# Patient Record
Sex: Female | Born: 1995
Health system: Southern US, Community
[De-identification: ages and names within clinical notes are randomized; demographics above are authoritative.]

## PROBLEM LIST (undated history)

## (undated) DIAGNOSIS — J45909 Unspecified asthma, uncomplicated: Secondary | ICD-10-CM

## (undated) DIAGNOSIS — T7840XA Allergy, unspecified, initial encounter: Secondary | ICD-10-CM

## (undated) DIAGNOSIS — F419 Anxiety disorder, unspecified: Secondary | ICD-10-CM

## (undated) DIAGNOSIS — L309 Dermatitis, unspecified: Secondary | ICD-10-CM

## (undated) HISTORY — PX: MOUTH SURGERY: SHX715

## (undated) HISTORY — DX: Dermatitis, unspecified: L30.9

## (undated) HISTORY — DX: Anxiety disorder, unspecified: F41.9

## (undated) HISTORY — DX: Allergy, unspecified, initial encounter: T78.40XA

## (undated) HISTORY — DX: Unspecified asthma, uncomplicated: J45.909

---
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
## 2015-08-14 DIAGNOSIS — N946 Dysmenorrhea, unspecified: Secondary | ICD-10-CM | POA: Insufficient documentation

## 2016-08-04 HISTORY — PX: MOUTH SURGERY: SHX715

## 2018-01-25 ENCOUNTER — Ambulatory Visit
Admission: RE | Admit: 2018-01-25 | Discharge: 2018-01-25 | Disposition: A | Discharge status: Home / Self Care | Payer: Managed Care, Other (non HMO) | Source: Ambulatory Visit | Attending: Family Medicine | Admitting: Family Medicine

## 2018-01-25 ENCOUNTER — Ambulatory Visit (INDEPENDENT_AMBULATORY_CARE_PROVIDER_SITE_OTHER): Payer: Managed Care, Other (non HMO) | Admitting: Family Medicine

## 2018-01-25 ENCOUNTER — Encounter: Payer: Self-pay | Admitting: Family Medicine

## 2018-01-25 VITALS — BP 110/70 | HR 68 | Temp 99.9°F | Resp 16 | Ht 61.5 in | Wt 135.0 lb

## 2018-01-25 DIAGNOSIS — R05 Cough: Secondary | ICD-10-CM | POA: Diagnosis present

## 2018-01-25 DIAGNOSIS — J309 Allergic rhinitis, unspecified: Secondary | ICD-10-CM

## 2018-01-25 DIAGNOSIS — R059 Cough, unspecified: Secondary | ICD-10-CM

## 2018-01-25 MED ORDER — TRIAMCINOLONE ACETONIDE 55 MCG/ACT NA AERO: 2.0000 | INHALATION_SPRAY | Freq: Every day | NASAL | 12 refills | Status: DC

## 2018-01-25 MED ORDER — MONTELUKAST SODIUM 10 MG PO TABS
10.0000 mg | ORAL_TABLET | Freq: Every day | ORAL | 3 refills | Status: DC
Start: 2018-01-25 — End: 2018-06-19

## 2018-01-25 NOTE — Progress Notes (Signed)
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 In recent months she has had a nonproductive cough and at times feels like she is wheezing. Siome mild anxiety. There are no active problems to display for this patient.   Past Medical History:  Diagnosis Date  . Allergy   . Anxiety     Social History   Socioeconomic History  . Marital status: Married    Spouse name: Not on file  . Number of children: Not on file  . Years of education: Not on file  . Highest education level: Not on file  Occupational History  . Not on file  Social Needs  . Financial resource strain: Not on file  . Food insecurity:    Worry: Not on file    Inability: Not on file  . Transportation needs:    Medical: Not on file    Non-medical: Not on file  Tobacco Use  . Smoking status: Never Smoker  Substance and Sexual Activity  . Alcohol use: Never    Frequency: Never  . Drug use: Never  . Sexual activity: Not on file  Lifestyle  . Physical activity:    Days per week: Not on file    Minutes per session: Not on file  . Stress: Not on file  Relationships  . Social connections:    Talks on phone: Not on file    Gets together: Not on file    Attends religious service: Not on file    Active member of club or organization: Not on file    Attends meetings of clubs or organizations: Not on file    Relationship status: Not on file  . Intimate partner violence:    Fear of current or ex partner: Not on file    Emotionally abused: Not on file    Physically abused: Not on file    Forced sexual activity: Not on file  Other Topics Concern  . Not on file  Social History Narrative  . Not on file    Outpatient Encounter Medications as of 01/25/2018  Medication Sig  . cetirizine (ZYRTEC ALLERGY) 10 MG tablet Take 10 mg by mouth daily.  Marland Kitchen levonorgestrel-ethinyl estradiol (AVIANE,ALESSE,LESSINA)  0.1-20 MG-MCG tablet Take 1 tablet by mouth daily.   No facility-administered encounter medications on file as of 01/25/2018.     Allergies  Allergen Reactions  . Augmentin [Amoxicillin-Pot Clavulanate] Nausea And Vomiting  . Sulfa Antibiotics Nausea And Vomiting    Review of Systems  Constitutional: Negative.   HENT: Negative.        Sneezing rhinorrhea  Eyes: Negative.        Eyes itching  Respiratory: Positive for cough and wheezing.   Cardiovascular: Negative.   Gastrointestinal: Negative.   Genitourinary: Negative.   Musculoskeletal: Negative.   Skin: Negative.   Neurological: Negative.   Endo/Heme/Allergies: Positive for environmental allergies.  Psychiatric/Behavioral: The patient is nervous/anxious.     Objective:  BP 110/70 (BP Location: Right Arm, Patient Position: Sitting, Cuff Size: Normal)   Pulse 68   Temp 99.9 F (37.7 C) (Oral)   Resp 16   Ht 5' 1.5" (1.562 m)   Wt 135 lb (61.2 kg)   BMI 25.09 kg/m   Physical Exam  Constitutional: She is oriented to person, place, and time and well-developed, well-nourished, and in no distress.  HENT:  Head: Normocephalic and atraumatic.  Right Ear: External ear normal.  Left Ear: External ear normal.  Nose: Nose normal.  Mouth/Throat: Oropharynx is clear and moist.  2+ tonsils.  Eyes: Conjunctivae are normal. No scleral icterus.  Neck: No thyromegaly present.  Cardiovascular: Normal rate, regular rhythm and normal heart sounds.  Pulmonary/Chest: Effort normal and breath sounds normal.  Abdominal: Soft.  Lymphadenopathy:    She has no cervical adenopathy.  Neurological: She is alert and oriented to person, place, and time. Gait normal. GCS score is 15.  Skin: Skin is warm and dry.  Psychiatric: Mood, memory, affect and judgment normal.    Assessment and Plan :  1. Cough/Allergic Rhinitis./Mild Asthma - DG Chest 2 View; Future RTC 1 month. - montelukast (SINGULAIR) 10 MG tablet; Take 1 tablet (10 mg total)  by mouth at bedtime.  Dispense: 30 tablet; Refill: 3 - triamcinolone (NASACORT) 55 MCG/ACT AERO nasal inhaler; Place 2 sprays into the nose daily.  Dispense: 1 Inhaler; Refill: 12  I have done the exam and reviewed the chart and it is accurate to the best of my knowledge. [employer]  technology has been used and  any errors in dictation or transcription are unintentional. Julieanne Mansonichard Gilbert M.D. [facility]

## 2018-01-26 ENCOUNTER — Telehealth: Payer: Self-pay | Admitting: Family Medicine

## 2018-01-26 NOTE — Progress Notes (Signed)
Advised  ED 

## 2018-01-27 ENCOUNTER — Encounter: Payer: Self-pay | Admitting: Family Medicine

## 2018-02-24 ENCOUNTER — Ambulatory Visit (INDEPENDENT_AMBULATORY_CARE_PROVIDER_SITE_OTHER): Payer: Managed Care, Other (non HMO) | Admitting: Family Medicine

## 2018-02-24 VITALS — BP 93/61 | HR 66 | Temp 98.5°F | Resp 14 | Wt 135.0 lb

## 2018-02-24 DIAGNOSIS — J3089 Other allergic rhinitis: Secondary | ICD-10-CM

## 2018-02-24 NOTE — Progress Notes (Signed)
Belinda Bentley  MRN: [id number] DOB: [birth year]  Subjective:  HPI   The patient is a 22 year old female who presents for follow up after treatment for persistent cough.  She was last seen on 01/25/18.  At that time she was given Zyrtec, Singulair and Nasacort.  She reports that her cough is much improved and she continues on the medications.   There are no active problems to display for this patient.   Past Medical History:  Diagnosis Date  . Allergy   . Anxiety     Social History   Socioeconomic History  . Marital status: Married    Spouse name: Not on file  . Number of children: Not on file  . Years of education: Not on file  . Highest education level: Not on file  Occupational History  . Occupation: Geophysicist/field seismologistTeaching Assistant  Social Needs  . Financial resource strain: Not hard at all  . Food insecurity:    Worry: Never true    Inability: Not on file  . Transportation needs:    Medical: No    Non-medical: No  Tobacco Use  . Smoking status: Never Smoker  . Smokeless tobacco: Never Used  Substance and Sexual Activity  . Alcohol use: Never    Frequency: Never  . Drug use: Yes  . Sexual activity: Not on file  Lifestyle  . Physical activity:    Days per week: 1 day    Minutes per session: 30 min  . Stress: Not at all  Relationships  . Social connections:    Talks on phone: More than three times a week    Gets together: Three times a week    Attends religious service: More than 4 times per year    Active member of club or organization: Yes    Attends meetings of clubs or organizations: More than 4 times per year    Relationship status: Married  . Intimate partner violence:    Fear of current or ex partner: No    Emotionally abused: No    Physically abused: No    Forced sexual activity: No  Other Topics Concern  . Not on file  Social History Narrative  . Not on file    Outpatient Encounter Medications as of 02/24/2018  Medication Sig  . cetirizine (ZYRTEC  ALLERGY) 10 MG tablet Take 10 mg by mouth daily.  Marland Kitchen. levonorgestrel-ethinyl estradiol (AVIANE,ALESSE,LESSINA) 0.1-20 MG-MCG tablet Take 1 tablet by mouth daily.  . montelukast (SINGULAIR) 10 MG tablet Take 1 tablet (10 mg total) by mouth at bedtime.  . triamcinolone (NASACORT) 55 MCG/ACT AERO nasal inhaler Place 2 sprays into the nose daily.   No facility-administered encounter medications on file as of 02/24/2018.     Allergies  Allergen Reactions  . Augmentin [Amoxicillin-Pot Clavulanate] Nausea And Vomiting  . Sulfa Antibiotics Nausea And Vomiting    Review of Systems  Constitutional: Negative.   HENT: Negative for congestion, sinus pain and sore throat.   Eyes: Negative.   Respiratory: Negative for cough and shortness of breath.   Cardiovascular: Negative for chest pain and palpitations.  Skin: Negative.   Psychiatric/Behavioral: Negative.     Objective:  BP 93/61 (BP Location: Right Arm, Patient Position: Sitting, Cuff Size: Normal)   Pulse 66   Temp 98.5 F (36.9 C) (Oral)   Resp 14   Wt 135 lb (61.2 kg)   BMI 25.09 kg/m   Physical Exam  Constitutional: She is oriented to person, place, and time  and well-developed, well-nourished, and in no distress.  HENT:  Head: Normocephalic and atraumatic.  Eyes: Conjunctivae are normal.  Neck: No thyromegaly present.  Very small submental lymph node--mobile--small pea size.  Cardiovascular: Normal rate, regular rhythm and normal heart sounds.  Pulmonary/Chest: Effort normal and breath sounds normal.  Lymphadenopathy:    She has no cervical adenopathy.  Neurological: She is alert and oriented to person, place, and time. Gait normal. GCS score is 15.  Skin: Skin is warm and dry.  Psychiatric: Mood, memory, affect and judgment normal.    Assessment and Plan :  Allergic Rhinitis Improved.Consider stopping monteleukast to see if therapeutic. RTC prn.  I have done the exam and reviewed the chart and it is accurate to the best  of my knowledge. Dentist has been used and  any errors in dictation or transcription are unintentional. Julieanne Manson M.D. [facility]

## 2018-05-28 ENCOUNTER — Ambulatory Visit (INDEPENDENT_AMBULATORY_CARE_PROVIDER_SITE_OTHER): Payer: Managed Care, Other (non HMO) | Admitting: Physician Assistant

## 2018-05-28 ENCOUNTER — Encounter: Payer: Self-pay | Admitting: Physician Assistant

## 2018-05-28 VITALS — BP 110/70 | HR 87 | Temp 97.7°F | Resp 16 | Wt 135.0 lb

## 2018-05-28 DIAGNOSIS — J029 Acute pharyngitis, unspecified: Secondary | ICD-10-CM

## 2018-05-28 DIAGNOSIS — J020 Streptococcal pharyngitis: Secondary | ICD-10-CM | POA: Diagnosis not present

## 2018-05-28 LAB — POCT RAPID STREP A (OFFICE): Rapid Strep A Screen: POSITIVE — AB

## 2018-05-28 MED ORDER — PREDNISONE 20 MG PO TABS: 20.0000 mg | ORAL_TABLET | Freq: Every day | ORAL | 0 refills | Status: AC

## 2018-05-28 MED ORDER — AZITHROMYCIN 250 MG PO TABS: ORAL_TABLET | ORAL | 0 refills | Status: DC

## 2018-05-28 NOTE — Patient Instructions (Addendum)

## 2018-05-28 NOTE — Progress Notes (Signed)
Patient: Belinda Bentley Female    DOB: 04-19-1996   22 y.o.   MRN: [id number] Visit Date: 05/28/2018  Today's Provider: Trey Sailors, PA-C   Chief Complaint  Patient presents with  . Sore Throat   Subjective:    HPI Sore Throat: Patient complains of sore throat. Associated symptoms include enlarged tonsils, sneezing and sore throat.Onset of symptoms was 2 days ago, gradually worsening since that time. She is drinking plenty of fluids. She has not had recent close exposure to someone with proven streptococcal pharyngitis. 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      Allergies  Allergen Reactions  . Augmentin [Amoxicillin-Pot Clavulanate] Nausea And Vomiting  . Sulfa Antibiotics Nausea And Vomiting     Current Outpatient Medications:  .  cetirizine (ZYRTEC ALLERGY) 10 MG tablet, Take 10 mg by mouth daily., Disp: , Rfl:  .  levonorgestrel-ethinyl estradiol (AVIANE,ALESSE,LESSINA) 0.1-20 MG-MCG tablet, Take 1 tablet by mouth daily., Disp: , Rfl:  .  montelukast (SINGULAIR) 10 MG tablet, Take 1 tablet (10 mg total) by mouth at bedtime., Disp: 30 tablet, Rfl: 3 .  triamcinolone (NASACORT) 55 MCG/ACT AERO nasal inhaler, Place 2 sprays into the nose daily., Disp: 1 Inhaler, Rfl: 12  Review of Systems  Constitutional: Positive for fatigue.  HENT: Positive for sneezing and sore throat.     Social History   Tobacco Use  . Smoking status: Never Smoker  . Smokeless tobacco: Never Used  Substance Use Topics  . Alcohol use: Never    Frequency: Never   Objective:   BP 110/70 (BP Location: Right Arm, Patient Position: Sitting, Cuff Size: Normal)   Pulse 87   Temp 97.7 F (36.5 C) (Oral)   Resp 16   Wt 135 lb (61.2 kg)   SpO2 98%   BMI 25.09 kg/m  Vitals:   05/28/18 0932  BP: 110/70  Pulse: 87  Resp: 16  Temp: 97.7 F (36.5 C)  TempSrc: Oral  SpO2: 98%  Weight: 135 lb (61.2 kg)     Physical Exam  Constitutional: She  is oriented to person, place, and time. She appears well-developed and well-nourished. She does not appear ill.  HENT:  Right Ear: Tympanic membrane and ear canal normal.  Left Ear: Tympanic membrane and ear canal normal.  Mouth/Throat: Uvula is midline and mucous membranes are normal. No uvula swelling. Posterior oropharyngeal edema and posterior oropharyngeal erythema present. No oropharyngeal exudate.  Neck: Neck supple.  Cardiovascular: Normal rate, regular rhythm and normal heart sounds.  Pulmonary/Chest: Effort normal and breath sounds normal.  Lymphadenopathy:    She has no cervical adenopathy.  Neurological: She is alert and oriented to person, place, and time.  Skin: Skin is warm and dry.  Psychiatric: She has a normal mood and affect. Her behavior is normal.        Assessment & Plan:     1. Sore throat  Rapid strep positive. N/V with augmentin. Will treat as below. - POCT rapid strep A  2. Strep sore throat  - azithromycin (ZITHROMAX) 250 MG tablet; Take 2 pills on day 1, 1 pill on each of the following four days.  Dispense: 6 each; Refill: 0 - predniSONE (DELTASONE) 20 MG tablet; Take 1 tablet (20 mg total) by mouth daily with breakfast for 3 days.  Dispense: 3 tablet; Refill: 0  Return if symptoms worsen or fail to improve.  The entirety of the information documented in the  History of Present Illness, Review of Systems and Physical Exam were personally obtained by me. Portions of this information were initially documented by Rondel Baton, CMA and reviewed by me for thoroughness and accuracy.        Trey Sailors, PA-C  [facility]

## 2018-06-10 ENCOUNTER — Encounter: Payer: Self-pay | Admitting: Family Medicine

## 2018-06-10 ENCOUNTER — Ambulatory Visit (INDEPENDENT_AMBULATORY_CARE_PROVIDER_SITE_OTHER): Payer: Managed Care, Other (non HMO) | Admitting: Family Medicine

## 2018-06-10 VITALS — BP 113/78 | HR 98 | Temp 98.4°F | Wt 133.8 lb

## 2018-06-10 DIAGNOSIS — K119 Disease of salivary gland, unspecified: Secondary | ICD-10-CM | POA: Diagnosis not present

## 2018-06-10 NOTE — Progress Notes (Signed)
Patient: Belinda Bentley Female    DOB: 05/22/1996   22 y.o.   MRN: [id number] Visit Date: 06/10/2018  Today's Provider: Shirlee Latch, MD   Chief Complaint  Patient presents with  . Sore Throat   Subjective:    I, Presley Raddle, CMA, am acting as a scribe for Shirlee Latch, MD.   Sore Throat   This is a recurrent problem. Episode onset: 2 days ago. Neither side of throat is experiencing more pain than the other. The patient is experiencing no pain. Associated symptoms include swollen glands. She has had exposure to strep (Patient was diagnosed on 05/28/2018 and treated with Z-pak and Prednisone).  Symtpoms improved with medication but now she has gotten worse (describes it as uncomfortable and not painful).  Tonsils feel larger than normal.   Allergies  Allergen Reactions  . Augmentin [Amoxicillin-Pot Clavulanate] Nausea And Vomiting  . Sulfa Antibiotics Nausea And Vomiting     Current Outpatient Medications:  .  cetirizine (ZYRTEC ALLERGY) 10 MG tablet, Take 10 mg by mouth daily., Disp: , Rfl:  .  levonorgestrel-ethinyl estradiol (AVIANE,ALESSE,LESSINA) 0.1-20 MG-MCG tablet, Take 1 tablet by mouth daily., Disp: , Rfl:  .  montelukast (SINGULAIR) 10 MG tablet, Take 1 tablet (10 mg total) by mouth at bedtime., Disp: 30 tablet, Rfl: 3 .  triamcinolone (NASACORT) 55 MCG/ACT AERO nasal inhaler, Place 2 sprays into the nose daily. (Patient not taking: Reported on 06/10/2018), Disp: 1 Inhaler, Rfl: 12  Review of Systems  Constitutional: Negative.   HENT: Sore throat: discomfort, and tonsils are swollen.   Cardiovascular: Negative.     Social History   Tobacco Use  . Smoking status: Never Smoker  . Smokeless tobacco: Never Used  Substance Use Topics  . Alcohol use: Never    Frequency: Never   Objective:   BP 113/78 (BP Location: Right Arm, Patient Position: Sitting, Cuff Size: Normal)   Pulse 98   Temp 98.4 F (36.9 C) (Oral)   Wt 133 lb 12.8 oz (60.7 kg)    SpO2 99%   BMI 24.87 kg/m  Vitals:   06/10/18 1504  BP: 113/78  Pulse: 98  Temp: 98.4 F (36.9 C)  TempSrc: Oral  SpO2: 99%  Weight: 133 lb 12.8 oz (60.7 kg)     Physical Exam  Constitutional: She is oriented to person, place, and time. She appears well-developed and well-nourished. She does not appear ill. No distress.  HENT:  Head: Normocephalic and atraumatic.  Right Ear: Tympanic membrane and ear canal normal.  Left Ear: Tympanic membrane and ear canal normal.  Mouth/Throat: Uvula is midline, oropharynx is clear and moist and mucous membranes are normal. No oral lesions. No oropharyngeal exudate, posterior oropharyngeal edema, posterior oropharyngeal erythema or tonsillar abscesses. No tonsillar exudate.  Mildly swollen and tender submandibular salivary glands  Eyes: Pupils are equal, round, and reactive to light.  Neck: Trachea normal. Neck supple. No thyromegaly present.  Cardiovascular: Normal rate, regular rhythm, normal heart sounds and intact distal pulses.  No murmur heard. Pulmonary/Chest: Effort normal and breath sounds normal. No respiratory distress. She has no wheezes. She has no rales.  Abdominal: Soft. There is no tenderness.  Lymphadenopathy:    She has no cervical adenopathy.  Neurological: She is alert and oriented to person, place, and time.  Skin: Skin is warm and dry. Capillary refill takes less than 2 seconds. No rash noted.  Psychiatric: She has a normal mood and affect. Her behavior is normal.  Vitals reviewed.  Assessment & Plan:   1. Salivary gland disease - no evidence of pharyngitis - mildly swollen submandibular salivary gladns - likely related to recent pharyngitis - discussed symptomatic management, natural course, and return precautions    Return if symptoms worsen or fail to improve.   The entirety of the information documented in the History of Present Illness, Review of Systems and Physical Exam were personally obtained by  me. Portions of this information were initially documented by Presley Raddle, CMA and reviewed by me for thoroughness and accuracy.    Erasmo Downer, MD, MPH [facility] 06/10/2018 3:29 PM

## 2018-06-19 ENCOUNTER — Other Ambulatory Visit: Payer: Self-pay | Admitting: Family Medicine

## 2018-06-19 DIAGNOSIS — R059 Cough, unspecified: Secondary | ICD-10-CM

## 2018-06-19 DIAGNOSIS — R05 Cough: Secondary | ICD-10-CM

## 2018-09-07 ENCOUNTER — Ambulatory Visit (INDEPENDENT_AMBULATORY_CARE_PROVIDER_SITE_OTHER): Payer: Managed Care, Other (non HMO) | Admitting: Family Medicine

## 2018-09-07 ENCOUNTER — Encounter: Payer: Self-pay | Admitting: Family Medicine

## 2018-09-07 VITALS — BP 102/62 | HR 84 | Temp 98.3°F | Wt 133.4 lb

## 2018-09-07 DIAGNOSIS — J4521 Mild intermittent asthma with (acute) exacerbation: Secondary | ICD-10-CM

## 2018-09-07 DIAGNOSIS — R05 Cough: Secondary | ICD-10-CM | POA: Diagnosis not present

## 2018-09-07 DIAGNOSIS — R059 Cough, unspecified: Secondary | ICD-10-CM

## 2018-09-07 MED ORDER — HYDROCOD POLST-CPM POLST ER 10-8 MG/5ML PO SUER: 5.0000 mL | Freq: Two times a day (BID) | ORAL | 0 refills | Status: DC | PRN

## 2018-09-07 NOTE — Progress Notes (Signed)
Patient: Belinda Bentley Female    DOB: August 01, 1996   22 y.o.   MRN: [id number] Visit Date: 09/07/2018  Today's Provider: [name], MD   Chief Complaint  Patient presents with  . URI   Subjective:     URI   This is a new problem. Episode onset: 2 weeks ago. The problem has been gradually worsening. There has been no fever. Associated symptoms include congestion, coughing and rhinorrhea. She has tried inhaler use (Sudafed, Vicks, Mucinex, nasal spray, honey, using infuser and humidifier) for the symptoms. The treatment provided no relief.    Allergies  Allergen Reactions  . Augmentin [Amoxicillin-Pot Clavulanate] Nausea And Vomiting  . Sulfa Antibiotics Nausea And Vomiting     Current Outpatient Medications:  .  cetirizine (ZYRTEC ALLERGY) 10 MG tablet, Take 10 mg by mouth daily., Disp: , Rfl:  .  levonorgestrel-ethinyl estradiol (AVIANE,ALESSE,LESSINA) 0.1-20 MG-MCG tablet, Take 1 tablet by mouth daily., Disp: , Rfl:  .  montelukast (SINGULAIR) 10 MG tablet, TAKE 1 TABLET BY MOUTH EVERY NIGHT AT BEDTIME, Disp: 90 tablet, Rfl: 0 .  triamcinolone (NASACORT) 55 MCG/ACT AERO nasal inhaler, Place 2 sprays into the nose daily., Disp: 1 Inhaler, Rfl: 12  Review of Systems  Constitutional: Negative.   HENT: Positive for congestion and rhinorrhea.   Eyes: Negative.   Respiratory: Positive for cough.   Gastrointestinal: Negative.   Endocrine: Negative.   Musculoskeletal: Negative.   Allergic/Immunologic: Negative.   Neurological: Negative.   Psychiatric/Behavioral: Negative.     Social History   Tobacco Use  . Smoking status: Never Smoker  . Smokeless tobacco: Never Used  Substance Use Topics  . Alcohol use: Never    Frequency: Never      Objective:   BP 102/62 (BP Location: Right Arm, Patient Position: Sitting, Cuff Size: Normal)   Pulse 84   Temp 98.3 F (36.8 C) (Oral)   Wt 133 lb 6.4 oz (60.5 kg)   SpO2 98%   BMI 24.80 kg/m  Vitals:    09/07/18 1425  BP: 102/62  Pulse: 84  Temp: 98.3 F (36.8 C)  TempSrc: Oral  SpO2: 98%  Weight: 133 lb 6.4 oz (60.5 kg)     Physical Exam Constitutional:      Appearance: Normal appearance.  HENT:     Head: Normocephalic and atraumatic.     Right Ear: External ear normal.     Left Ear: External ear normal.     Nose: Nose normal.     Mouth/Throat:     Pharynx: Oropharynx is clear.  Eyes:     General: No scleral icterus.    Conjunctiva/sclera: Conjunctivae normal.  Cardiovascular:     Rate and Rhythm: Normal rate and regular rhythm.     Pulses: Normal pulses.     Heart sounds: Normal heart sounds.  Pulmonary:     Effort: Pulmonary effort is normal.     Breath sounds: Wheezing present.     Comments: Mild expiratory wheezes.  Improved after Xopenex nebulizer. Abdominal:     Palpations: Abdomen is soft.  Lymphadenopathy:     Cervical: No cervical adenopathy.  Skin:    General: Skin is warm and dry.  Neurological:     General: No focal deficit present.     Mental Status: She is alert and oriented to person, place, and time.  Psychiatric:        Mood and Affect: Mood normal.        Behavior:  Behavior normal.        Thought Content: Thought content normal.        Judgment: Judgment normal.         Assessment & Plan    1. Cough Tussionex for nocturnal cough.  2. Mild intermittent asthmatic bronchitis with acute exacerbation Add Anoro 1 puff daily.  I have done the exam and reviewed the chart and it is accurate to the best of my knowledge. Dentist has been used and  any errors in dictation or transcription are unintentional. Julieanne Manson M.D. [facility]      [name], MD  [facility]

## 2018-09-19 ENCOUNTER — Other Ambulatory Visit: Payer: Self-pay | Admitting: Family Medicine

## 2018-09-19 DIAGNOSIS — R05 Cough: Secondary | ICD-10-CM

## 2018-09-19 DIAGNOSIS — R059 Cough, unspecified: Secondary | ICD-10-CM

## 2018-09-20 NOTE — Telephone Encounter (Signed)
Pharmacy requesting refills. Thanks!  

## 2018-10-01 ENCOUNTER — Ambulatory Visit (INDEPENDENT_AMBULATORY_CARE_PROVIDER_SITE_OTHER): Payer: Managed Care, Other (non HMO) | Admitting: Physician Assistant

## 2018-10-01 ENCOUNTER — Encounter: Payer: Self-pay | Admitting: Physician Assistant

## 2018-10-01 VITALS — BP 116/68 | Temp 100.9°F | Wt 133.4 lb

## 2018-10-01 DIAGNOSIS — J029 Acute pharyngitis, unspecified: Secondary | ICD-10-CM

## 2018-10-01 DIAGNOSIS — J020 Streptococcal pharyngitis: Secondary | ICD-10-CM

## 2018-10-01 LAB — POCT RAPID STREP A (OFFICE): Rapid Strep A Screen: POSITIVE — AB

## 2018-10-01 MED ORDER — AZITHROMYCIN 250 MG PO TABS: ORAL_TABLET | ORAL | 0 refills | Status: DC

## 2018-10-01 MED ORDER — PREDNISONE 20 MG PO TABS: 20.0000 mg | ORAL_TABLET | Freq: Every day | ORAL | 0 refills | Status: AC

## 2018-10-01 NOTE — Patient Instructions (Signed)

## 2018-10-01 NOTE — Progress Notes (Signed)
Patient: Belinda Bentley Female    DOB: Aug 21, 1995   23 y.o.   MRN: [id number] Visit Date: 10/01/2018  Today's Provider: Trey Sailors, PA-C   Chief Complaint  Patient presents with  . URI   Subjective:     URI   This is a new problem. The current episode started in the past 7 days. The problem has been gradually worsening. The maximum temperature recorded prior to her arrival was 102 - 102.9 F (arranges from 100 to 102). The fever has been present for 1 to 2 days. Associated symptoms include a sore throat. She has tried nothing for the symptoms.    Allergies  Allergen Reactions  . Augmentin [Amoxicillin-Pot Clavulanate] Nausea And Vomiting  . Sulfa Antibiotics Nausea And Vomiting     Current Outpatient Medications:  .  cetirizine (ZYRTEC ALLERGY) 10 MG tablet, Take 10 mg by mouth daily., Disp: , Rfl:  .  chlorpheniramine-HYDROcodone (TUSSIONEX PENNKINETIC ER) 10-8 MG/5ML SUER, Take 5 mLs by mouth every 12 (twelve) hours as needed for cough., Disp: 140 mL, Rfl: 0 .  levonorgestrel-ethinyl estradiol (AVIANE,ALESSE,LESSINA) 0.1-20 MG-MCG tablet, Take 1 tablet by mouth daily., Disp: , Rfl:  .  montelukast (SINGULAIR) 10 MG tablet, TAKE 1 TABLET BY MOUTH EVERY NIGHT AT BEDTIME, Disp: 90 tablet, Rfl: 3 .  triamcinolone (NASACORT) 55 MCG/ACT AERO nasal inhaler, Place 2 sprays into the nose daily., Disp: 1 Inhaler, Rfl: 12  Review of Systems  Constitutional: Positive for appetite change and fever.  HENT: Positive for sore throat.   Respiratory: Negative.   Neurological: Negative.     Social History   Tobacco Use  . Smoking status: Never Smoker  . Smokeless tobacco: Never Used  Substance Use Topics  . Alcohol use: Never    Frequency: Never      Objective:   BP 116/68 (BP Location: Right Arm, Patient Position: Sitting, Cuff Size: Normal)   Temp (!) 100.9 F (38.3 C) (Oral)   Wt 133 lb 6.4 oz (60.5 kg)   BMI 24.80 kg/m  Vitals:   10/01/18 0913  BP: 116/68    Temp: (!) 100.9 F (38.3 C)  TempSrc: Oral  Weight: 133 lb 6.4 oz (60.5 kg)     Physical Exam Constitutional:      Appearance: Normal appearance. She is normal weight. She is not ill-appearing.  HENT:     Right Ear: Tympanic membrane and ear canal normal.     Left Ear: Tympanic membrane and ear canal normal.     Mouth/Throat:     Mouth: Mucous membranes are moist.     Pharynx: Pharyngeal swelling and posterior oropharyngeal erythema present. No oropharyngeal exudate or uvula swelling.     Tonsils: Tonsillar exudate present. Swelling: 3+ on the right. 3+ on the left.  Cardiovascular:     Rate and Rhythm: Normal rate and regular rhythm.     Heart sounds: Normal heart sounds.  Pulmonary:     Effort: Pulmonary effort is normal.     Breath sounds: Normal breath sounds.  Lymphadenopathy:     Cervical: No cervical adenopathy.  Skin:    General: Skin is warm and dry.  Neurological:     Mental Status: She is oriented to person, place, and time. Mental status is at baseline.  Psychiatric:        Mood and Affect: Mood normal.        Behavior: Behavior normal.         Assessment &  Plan    1. 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 She might considered getting tonsils removed.   - azithromycin (ZITHROMAX) 250 MG tablet; Take 2 pills on day 1, then 1 pill on each of the following four days.  Dispense: 6 tablet; Refill: 0 - predniSONE (DELTASONE) 20 MG tablet; Take 1 tablet (20 mg total) by mouth daily with breakfast for 5 days.  Dispense: 5 tablet; Refill: 0  2. Sore throat  - POCT rapid strep A  The entirety of the information documented in the History of Present Illness, Review of Systems and Physical Exam were personally obtained by me. Portions of this information were initially documented by Rondel Baton, CMA and reviewed by me for thoroughness and accuracy.   Return if symptoms  worsen or fail to improve.      Trey Sailors, PA-C  [facility]

## 2019-05-18 ENCOUNTER — Other Ambulatory Visit: Payer: Self-pay

## 2019-05-18 DIAGNOSIS — Z20822 Contact with and (suspected) exposure to covid-19: Secondary | ICD-10-CM

## 2019-05-19 LAB — NOVEL CORONAVIRUS, NAA: SARS-CoV-2, NAA: NOT DETECTED

## 2019-07-14 ENCOUNTER — Encounter: Payer: Self-pay | Admitting: Adult Health

## 2019-07-14 ENCOUNTER — Telehealth (INDEPENDENT_AMBULATORY_CARE_PROVIDER_SITE_OTHER): Payer: Managed Care, Other (non HMO) | Admitting: Adult Health

## 2019-07-14 ENCOUNTER — Telehealth: Payer: Self-pay

## 2019-07-14 DIAGNOSIS — Z8709 Personal history of other diseases of the respiratory system: Secondary | ICD-10-CM

## 2019-07-14 DIAGNOSIS — Z20828 Contact with and (suspected) exposure to other viral communicable diseases: Secondary | ICD-10-CM

## 2019-07-14 DIAGNOSIS — J029 Acute pharyngitis, unspecified: Secondary | ICD-10-CM

## 2019-07-14 MED ORDER — AZITHROMYCIN 250 MG PO TABS: ORAL_TABLET | ORAL | 0 refills | Status: DC

## 2019-07-14 MED ORDER — PREDNISONE 10 MG (21) PO TBPK: ORAL_TABLET | ORAL | 0 refills | Status: DC

## 2019-07-14 NOTE — Telephone Encounter (Signed)
Copied from [place] [id number]. Topic: General - Other >> Jul 14, 2019 12:36 PM Celene Kras wrote: Reason for CRM: Pt scheduled for mychart video visit. At 1:20 today. Please advise.

## 2019-07-14 NOTE — Progress Notes (Signed)
Patient: Belinda BachelorKelsi Woodin Female    DOB: [birth year]   23 y.o.   MRN: [id number] Visit Date: 07/14/2019  Today's Provider: Jairo BenMichelle Smith Flinchum, FNP   Chief Complaint  Patient presents with  . Sore Throat   Subjective:    Virtual Visit via Video Note  I connected with [name] on 07/14/19 at  1:20 PM EST by a video enabled telemedicine application and verified that I am speaking with the correct person using two identifiers.  Location: Patient: at home  Provider: Provider: Provider's [facility] at  [facility], [place].      I discussed the limitations of evaluation and management by telemedicine and the availability of in person appointments. The patient expressed understanding and agreed to proceed.   I discussed the assessment and treatment plan with the patient. The patient was provided an opportunity to ask questions and all were answered. The patient agreed with the plan and demonstrated an understanding of the instructions.   The patient was advised to call back or seek an in-person evaluation if the symptoms worsen or if the condition fails to improve as anticipated.  I provided 15  minutes of non-face-to-face time during this encounter.   Sore Throat  This is a new problem. The current episode started yesterday. The problem has been unchanged. The maximum temperature recorded prior to her arrival was 102 - 102.9 F. The fever has been present for less than 1 day. Associated symptoms include swollen glands. Pertinent negatives include no abdominal pain, congestion, coughing, diarrhea, drooling, ear discharge, ear pain, headaches, hoarse voice, plugged ear sensation, neck pain, shortness of breath, stridor, trouble swallowing (painful swallowing - do difficulty swallowing ) or vomiting. She has had no exposure to strep or mono. 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 She has tried NSAIDs  for the symptoms. The treatment provided no relief.  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  She denies any known exposures.   She reports she feels like she does with strep in past.  She has mild nasal drainage, itchy throat last night she thought her allergies were acting up.  Tonsils are enlarged " more than my normal" she reports.  She denies any exudate. She denies any nasal symptoms today. Denies any chest pain or pain with breathing.   Patient  denies any chills, rash, chest pain, shortness of breath, nausea, vomiting, or diarrhea.    Allergies  Allergen Reactions  . Augmentin [Amoxicillin-Pot Clavulanate] Nausea And Vomiting  . Sulfa Antibiotics Nausea And Vomiting     Current Outpatient Medications:  .  cetirizine (ZYRTEC ALLERGY) 10 MG tablet, Take 10 mg by mouth daily., Disp: , Rfl:  .  levonorgestrel-ethinyl estradiol (AVIANE,ALESSE,LESSINA) 0.1-20 MG-MCG tablet, Take 1 tablet by mouth daily., Disp: , Rfl:  .  montelukast (SINGULAIR) 10 MG tablet, TAKE 1 TABLET BY MOUTH EVERY NIGHT AT BEDTIME, Disp: 90 tablet, Rfl: 3  Review of Systems  Constitutional: Positive for fatigue and fever (102.3 today ). Negative for activity change, appetite change, chills, diaphoresis and unexpected weight change.  HENT: Positive for rhinorrhea (yesterday evening - has since resolved ) and sore throat. Negative for congestion, dental problem, drooling, ear discharge, ear pain, facial swelling, hearing loss, hoarse voice, mouth sores, nosebleeds, postnasal drip, sinus pressure, sinus pain, sneezing, tinnitus, trouble swallowing (painful swallowing - do difficulty swallowing )  and voice change.        She reports her tonsils are always larger but feel much bigger today than normal.   Eyes: Negative.   Respiratory: Negative for apnea, cough, choking, chest tightness, shortness of breath, wheezing and stridor.   Cardiovascular:  Negative for chest pain, palpitations and leg swelling.  Gastrointestinal: Negative for abdominal distention, abdominal pain, anal bleeding, blood in stool, constipation, diarrhea, nausea, rectal pain and vomiting.  Genitourinary: Negative for decreased urine volume, difficulty urinating, dyspareunia, dysuria, flank pain, frequency, hematuria and urgency.       Lmp 06/29/2019 denies any chance of pregnancy.   Musculoskeletal: Positive for myalgias. Negative for arthralgias, back pain, gait problem, joint swelling, neck pain and neck stiffness.       Generalized body aches reported   Skin: Negative for color change and rash.  Allergic/Immunologic: Positive for environmental allergies.  Neurological: Negative for dizziness, light-headedness and headaches.  Psychiatric/Behavioral: Negative.     Social History   Tobacco Use  . Smoking status: Never Smoker  . Smokeless tobacco: Never Used  Substance Use Topics  . Alcohol use: Never      Objective:   There were no vitals taken for this visit. There were no vitals filed for this visit.There is no height or weight on file to calculate BMI. Temperature reported by patient is in HPI. No other vitals available.   Physical Exam  Patient is alert and pleasant. She is oriented and responsive to questions Engages in conversation with provider. Speaks in full sentences without any pauses without any shortness of breath or distress.  Voice is normal in tone.   No stridor.    No results found for any visits on 07/14/19.     Assessment & Plan    1. History of strep pharyngitis  Will treat given her history, fever and reported enlarging tonsils.She is around young toddlers and  school aged children.   Hydrate with clear fluids. Motrin 600mg  every 8 hours PRN throat pain/ fever.  Discussed can not exclude Covid in differentials.  Advised she should be Covid tested at [facility] visitors entrance/ times/ days given - recommend waiting until  12/11  tor Monday the 14th to tested given onset of symptoms to obtain a more accurate result.  Patient verbalized understanding of all instructions given and denies any further questions at this time.   Work note sent to [employer] - return to work per [employer] on 07/25/2019 if meets guidelines.   Novel Coronavirus, NAA ([facility]); Future  2. Sore throat Can not exclude viral, Covid.  Will treat for bacterial given fever and history of strep in past. Reports tonsils larger than normal./ Declines clindamycin prefers Zithromax. Will call back if needed.   Meds ordered this encounter  Medications  . azithromycin (ZITHROMAX) 250 MG tablet    Sig: By mouth Take 2 tablets day 1 (500mg  total) and 1 tablet ( 250 mg ) on days 2,3,4,5.    Dispense:  6 tablet    Refill:  0  . predniSONE (STERAPRED UNI-PAK 21 TAB) 10 MG (21) TBPK tablet    Sig: PO: Take 6 tablets on day 1:Take 5 tablets day 2:Take 4 tablets day 3: Take 3 tablets day 4:Take 2 tablets day five: 5 Take 1 tablet day 6    Dispense:  21 tablet    Refill:  0   Discussed salt water gargles.  - Novel Coronavirus, NAA ([facility]); Future  3. Encounter for laboratory testing for COVID-19 virus Call  us once you receive results if you have question.  - Novel Coronavirus, NAA ([facility]); Future   Advised patient call the office or your primary care doctor for an appointment if no improvement within 72 hours or if any symptoms change or worsen at any time  Advised ER or urgent Care if after hours or on weekend. Call 911 for emergency symptoms at any time.Patinet verbalized understanding of all instructions given/reviewed and treatment plan and has no further questions or concerns at this time.       The entirety of the information documented in the History of Present Illness, Review of Systems and Physical Exam were personally obtained by me. Portions of this information were initially documented by the  Certified Medical Assistant whose name is documented in  Epic and reviewed by me for thoroughness and accuracy.  I have personally performed the exam and reviewed the chart and it is accurate to the best of my knowledge.  Museum/gallery conservator has been used and any errors in dictation or transcription are unintentional.  Eula Fried. Flinchum FNP-C  [facility]   Jairo Ben, FNP  [facility]

## 2019-07-14 NOTE — Patient Instructions (Signed)
COVID-19: How to Protect Yourself and Others Know how it spreads  There is currently no vaccine to prevent coronavirus disease 2019 (COVID-19).  The best way to prevent illness is to avoid being exposed to this virus.  The virus is thought to spread mainly from person-to-person. ? Between people who are in close contact with one another (within about 6 feet). ? Through respiratory droplets produced when an infected person coughs, sneezes or talks. ? These droplets can land in the mouths or noses of people who are nearby or possibly be inhaled into the lungs. ? Some recent studies have suggested that COVID-19 may be spread by people who are not showing symptoms. Everyone should Clean your hands often  Wash your hands often with soap and water for at least 20 seconds especially after you have been in a public place, or after blowing your nose, coughing, or sneezing.  If soap and water are not readily available, use a hand sanitizer that contains at least 60% alcohol. Cover all surfaces of your hands and rub them together until they feel dry.  Avoid touching your eyes, nose, and mouth with unwashed hands. Avoid close contact  Stay home if you are sick.  Avoid close contact with people who are sick.  Put distance between yourself and other people. ? Remember that some people without symptoms may be able to spread virus. ? This is especially important for people who are at higher risk of getting very [email or web address] Cover your mouth and nose with a cloth face cover when around others  You could spread COVID-19 to others even if you do not feel sick.  Everyone should wear a cloth face cover when they have to go out in public, for example to the grocery store or to pick up other necessities. ? Cloth face coverings should not be placed on young children under age 69, anyone who has trouble breathing, or is unconscious,  incapacitated or otherwise unable to remove the mask without assistance.  The cloth face cover is meant to protect other people in case you are infected.  Do NOT use a facemask meant for a Research scientist (physical sciences).  Continue to keep about 6 feet between yourself and others. The cloth face cover is not a substitute for social distancing. Cover coughs and sneezes  If you are in a private setting and do not have on your cloth face covering, remember to always cover your mouth and nose with a tissue when you cough or sneeze or use the inside of your elbow.  Throw used tissues in the trash.  Immediately wash your hands with soap and water for at least 20 seconds. If soap and water are not readily available, clean your hands with a hand sanitizer that contains at least 60% alcohol. Clean and disinfect  Clean AND disinfect frequently touched surfaces daily. This includes tables, doorknobs, light switches, countertops, handles, desks, phones, keyboards, toilets, faucets, and sinks. [email or web address]  If surfaces are dirty, clean them: Use detergent or soap and water prior to disinfection.  Then, use a household disinfectant. You can see a list of EPA-registered household disinfectants here. [email or web address] 12/07/2018 This information is not intended to replace advice given to you by your health care provider. Make sure you discuss any questions you have with your health care provider. Document Released: 11/16/2018 Document Revised: 12/15/2018 Document Reviewed: 11/16/2018 Elsevier Patient Education  2020 [employer] Inc. Sore Throat When you have a sore throat, your throat may feel:  Tender.  Burning.  Irritated.  Scratchy.  Painful when you swallow.  Painful when you talk. Many things can cause a sore throat, such as:  An infection.  Allergies.  Dry air.  Smoke or pollution.  Radiation treatment.   Gastroesophageal reflux disease (GERD).  A tumor. A sore throat can be the first sign of another sickness. It can happen with other problems, like:  Coughing.  Sneezing.  Fever.  Swelling in the neck. Most sore throats go away without treatment. Follow these instructions at home:      Take over-the-counter medicines only as told by your doctor. ? If your child has a sore throat, do not give your child aspirin.  Drink enough fluids to keep your pee (urine) pale yellow.  Rest when you feel you need to.  To help with pain: ? Sip warm liquids, such as broth, herbal tea, or warm water. ? Eat or drink cold or frozen liquids, such as frozen ice pops. ? Gargle with a salt-water mixture 3-4 times a day or as needed. To make a salt-water mixture, add -1 tsp (3-6 g) of salt to 1 cup (237 mL) of warm water. Mix it until you cannot see the salt anymore. ? Suck on hard candy or throat lozenges. ? Put a cool-mist humidifier in your bedroom at night. ? Sit in the bathroom with the door closed for 5-10 minutes while you run hot water in the shower.  Do not use any products that contain nicotine or tobacco, such as cigarettes, e-cigarettes, and chewing tobacco. If you need help quitting, ask your doctor.  Wash your hands well and often with soap and water. If soap and water are not available, use hand sanitizer. Contact a doctor if:  You have a fever for more than 2-3 days.  You keep having symptoms for more than 2-3 days.  Your throat does not get better in 7 days.  You have a fever and your symptoms suddenly get worse.  Your child who is 3 months to 27 years old has a temperature of 102.42F (39C) or higher. Get help right away if:  You have trouble breathing.  You cannot swallow fluids, soft foods, or your saliva.  You have swelling in your throat or neck that gets worse.  You keep feeling sick to your stomach (nauseous).  You keep throwing up (vomiting). Summary  A sore  throat is pain, burning, irritation, or scratchiness in the throat. Many things can cause a sore throat.  Take over-the-counter medicines only as told by your doctor. Do not give your child aspirin.  Drink plenty of fluids, and rest as needed.  Contact a doctor if your symptoms get worse or your sore throat does not get better within 7 days. This information is not intended to replace advice given to you by your health care provider. Make sure you discuss any questions you have with your health care provider. Document Released: 04/29/2008 Document Revised: 12/21/2017 Document Reviewed: 12/21/2017 Elsevier Patient Education  2020 Elsevier Inc. Azithromycin tablets What is this medicine? AZITHROMYCIN (az ith roe MYE sin) is a macrolide antibiotic. It is used to treat or prevent certain kinds of bacterial infections. It will not work for colds, flu, or other viral infections. This medicine may be used for other purposes; ask your health care provider or pharmacist if you have questions. COMMON BRAND NAME(S): Zithromax, Zithromax Tri-Pak, Zithromax Z-Pak What should I tell my health care provider before I take this medicine? They need to know if you have any of  these conditions:  history of blood diseases, like leukemia  history of irregular heartbeat  kidney disease  liver disease  myasthenia gravis  an unusual or allergic reaction to azithromycin, erythromycin, other macrolide antibiotics, foods, dyes, or preservatives  pregnant or trying to get pregnant  breast-feeding How should I use this medicine? Take this medicine by mouth with a full glass of water. Follow the directions on the prescription label. The tablets can be taken with food or on an empty stomach. If the medicine upsets your stomach, take it with food. Take your medicine at regular intervals. Do not take your medicine more often than directed. Take all of your medicine as directed even if you think your are better. Do  not skip doses or stop your medicine early. Talk to your pediatrician regarding the use of this medicine in children. While this drug may be prescribed for children as young as 6 months for selected conditions, precautions do apply. Overdosage: If you think you have taken too much of this medicine contact a poison control center or emergency room at once. NOTE: This medicine is only for you. Do not share this medicine with others. What if I miss a dose? If you miss a dose, take it as soon as you can. If it is almost time for your next dose, take only that dose. Do not take double or extra doses. What may interact with this medicine? Do not take this medicine with any of the following medications:  cisapride  dronedarone  pimozide  thioridazine This medicine may also interact with the following medications:  antacids that contain aluminum or magnesium  birth control pills  colchicine  cyclosporine  digoxin  ergot alkaloids like dihydroergotamine, ergotamine  nelfinavir  other medicines that prolong the QT interval (an abnormal heart rhythm)  phenytoin  warfarin This list may not describe all possible interactions. Give your health care provider a list of all the medicines, herbs, non-prescription drugs, or dietary supplements you use. Also tell them if you smoke, drink alcohol, or use illegal drugs. Some items may interact with your medicine. What should I watch for while using this medicine? Tell your doctor or healthcare provider if your symptoms do not start to get better or if they get worse. This medicine may cause serious skin reactions. They can happen weeks to months after starting the medicine. Contact your healthcare provider right away if you notice fevers or flu-like symptoms with a rash. The rash may be red or purple and then turn into blisters or peeling of the skin. Or, you might notice a red rash with swelling of the face, lips or lymph nodes in your neck or  under your arms. Do not treat diarrhea with over the counter products. Contact your doctor if you have diarrhea that lasts more than 2 days or if it is severe and watery. This medicine can make you more sensitive to the sun. Keep out of the sun. If you cannot avoid being in the sun, wear protective clothing and use sunscreen. Do not use sun lamps or tanning beds/booths. What side effects may I notice from receiving this medicine? Side effects that you should report to your doctor or health care professional as soon as possible:  allergic reactions like skin rash, itching or hives, swelling of the face, lips, or tongue  bloody or watery diarrhea  breathing problems  chest pain  fast, irregular heartbeat  muscle weakness  rash, fever, and swollen lymph nodes  redness, blistering, peeling,  or loosening of the skin, including inside the mouth  signs and symptoms of liver injury like dark yellow or brown urine; general ill feeling or flu-like symptoms; light-colored stools; loss of appetite; nausea; right upper belly pain; unusually weak or tired; yellowing of the eyes or skin  white patches or sores in the mouth  unusually weak or tired Side effects that usually do not require medical attention (report to your doctor or health care professional if they continue or are bothersome):  diarrhea  nausea  stomach pain  vomiting This list may not describe all possible side effects. Call your doctor for medical advice about side effects. You may report side effects to FDA at 1-800-FDA-1088. Where should I keep my medicine? Keep out of the reach of children. Store at room temperature between 15 and 30 degrees C (59 and 86 degrees F). Throw away any unused medicine after the expiration date. NOTE: This sheet is a summary. It may not cover all possible information. If you have questions about this medicine, talk to your doctor, pharmacist, or health care provider.  2020 Elsevier/Gold  Standard (2018-10-28 17:19:20) Pharyngitis  Pharyngitis is redness, pain, and swelling (inflammation) of the throat (pharynx). It is a very common cause of sore throat. Pharyngitis can be caused by a bacteria, but it is usually caused by a virus. Most cases of pharyngitis get better on their own without treatment. What are the causes? This condition may be caused by:  Infection by viruses (viral). Viral pharyngitis spreads from person to person (is contagious) through coughing, sneezing, and sharing of personal items or utensils such as cups, forks, spoons, and toothbrushes.  Infection by bacteria (bacterial). Bacterial pharyngitis may be spread by touching the nose or face after coming in contact with the bacteria, or through more intimate contact, such as kissing.  Allergies. Allergies can cause buildup of mucus in the throat (post-nasal drip), leading to inflammation and irritation. Allergies can also cause blocked nasal passages, forcing breathing through the mouth, which dries and irritates the throat. What increases the risk? You are more likely to develop this condition if:  You are 85-81 years old.  You are exposed to crowded environments such as daycare, school, or dormitory living.  You live in a cold climate.  You have a weakened disease-fighting (immune) system. What are the signs or symptoms? Symptoms of this condition vary by the cause (viral, bacterial, or allergies) and can include:  Sore throat.  Fatigue.  Low-grade fever.  Headache.  Joint pain and muscle aches.  Skin rashes.  Swollen glands in the throat (lymph nodes).  Plaque-like film on the throat or tonsils. This is often a symptom of bacterial pharyngitis.  Vomiting.  Stuffy nose (nasal congestion).  Cough.  Red, itchy eyes (conjunctivitis).  Loss of appetite. How is this diagnosed? This condition is often diagnosed based on your medical history and a physical exam. Your health care provider  will ask you questions about your illness and your symptoms. A swab of your throat may be done to check for bacteria (rapid strep test). Other lab tests may also be done, depending on the suspected cause, but these are rare. How is this treated? This condition usually gets better in 3-4 days without medicine. Bacterial pharyngitis may be treated with antibiotic medicines. Follow these instructions at home:  Take over-the-counter and prescription medicines only as told by your health care provider. ? If you were prescribed an antibiotic medicine, take it as told by your health care  provider. Do not stop taking the antibiotic even if you start to feel better. ? Do not give children aspirin because of the association with Reye syndrome.  Drink enough water and fluids to keep your urine clear or pale yellow.  Get a lot of rest.  Gargle with a salt-water mixture 3-4 times a day or as needed. To make a salt-water mixture, completely dissolve -1 tsp of salt in 1 cup of warm water.  If your health care provider approves, you may use throat lozenges or sprays to soothe your throat. Contact a health care provider if:  You have large, tender lumps in your neck.  You have a rash.  You cough up green, yellow-brown, or bloody spit. Get help right away if:  Your neck becomes stiff.  You drool or are unable to swallow liquids.  You cannot drink or take medicines without vomiting.  You have severe pain that does not go away, even after you take medicine.  You have trouble breathing, and it is not caused by a stuffy nose.  You have new pain and swelling in your joints such as the knees, ankles, wrists, or elbows. Summary  Pharyngitis is redness, pain, and swelling (inflammation) of the throat (pharynx).  While pharyngitis can be caused by a bacteria, the most common causes are viral.  Most cases of pharyngitis get better on their own without treatment.  Bacterial pharyngitis is treated  with antibiotic medicines. This information is not intended to replace advice given to you by your health care provider. Make sure you discuss any questions you have with your health care provider. Document Released: 07/21/2005 Document Revised: 07/03/2017 Document Reviewed: 08/26/2016 Elsevier Patient Education  2020 Elsevier Inc.  Prednisolone tablets What is this medicine? PREDNISOLONE (pred NISS oh lone) is a corticosteroid. It is commonly used to treat inflammation of the skin, joints, lungs, and other organs. Common conditions treated include asthma, allergies, and arthritis. It is also used for other conditions, such as blood disorders and diseases of the adrenal glands. This medicine may be used for other purposes; ask your health care provider or pharmacist if you have questions. COMMON BRAND NAME(S): Millipred, Millipred DP, Millipred DP 12-Day, Millipred DP 6 Day, Prednoral What should I tell my health care provider before I take this medicine? They need to know if you have any of these conditions:  Cushing's syndrome  diabetes  glaucoma  heart problems or disease  high blood pressure  infection such as herpes, measles, tuberculosis, or chickenpox  kidney disease  liver disease  mental problems  myasthenia gravis  osteoporosis  seizures  stomach ulcer or intestine disease including colitis and diverticulitis  thyroid problem  an unusual or allergic reaction to lactose, prednisolone, other medicines, foods, dyes, or preservatives  pregnant or trying to get pregnant  breast-feeding How should I use this medicine? Take this medicine by mouth with a glass of water. Follow the directions on the prescription label. Take it with food or milk to avoid stomach upset. If you are taking this medicine once a day, take it in the morning. Do not take more medicine than you are told to take. Do not suddenly stop taking your medicine because you may develop a severe  reaction. Your doctor will tell you how much medicine to take. If your doctor wants you to stop the medicine, the dose may be slowly lowered over time to avoid any side effects. Talk to your pediatrician regarding the use of this medicine in children.  Special care may be needed. Overdosage: If you think you have taken too much of this medicine contact a poison control center or emergency room at once. NOTE: This medicine is only for you. Do not share this medicine with others. What if I miss a dose? If you miss a dose, take it as soon as you can. If it is almost time for your next dose, take only that dose. Do not take double or extra doses. What may interact with this medicine? Do not take this medicine with any of the following medications:  metyrapone  mifepristone This medicine may also interact with the following medications:  aminoglutethimide  amphotericin B  aspirin and aspirin-like medicines  barbiturates  certain medicines for diabetes, like glipizide or glyburide  cholestyramine  cholinesterase inhibitors  cyclosporine  digoxin  diuretics  ephedrine  female hormones, like estrogens and birth control pills  isoniazid  ketoconazole  NSAIDS, medicines for pain and inflammation, like ibuprofen or naproxen  phenytoin  rifampin  toxoids  vaccines  warfarin This list may not describe all possible interactions. Give your health care provider a list of all the medicines, herbs, non-prescription drugs, or dietary supplements you use. Also tell them if you smoke, drink alcohol, or use illegal drugs. Some items may interact with your medicine. What should I watch for while using this medicine? Visit your doctor or health care professional for regular checks on your progress. If you are taking this medicine over a prolonged period, carry an identification card with your name and address, the type and dose of your medicine, and your doctor's name and address. This  medicine may increase your risk of getting an infection. Tell your doctor or health care professional if you are around anyone with measles or chickenpox, or if you develop sores or blisters that do not heal properly. If you are going to have surgery, tell your doctor or health care professional that you have taken this medicine within the last twelve months. Ask your doctor or health care professional about your diet. You may need to lower the amount of salt you eat. This medicine may increase blood sugar. Ask your healthcare provider if changes in diet or medicines are needed if you have diabetes. What side effects may I notice from receiving this medicine? Side effects that you should report to your doctor or health care professional as soon as possible:  allergic reactions like skin rash, itching or hives, swelling of the face, lips, or tongue  changes in emotions or moods  eye pain   signs and symptoms of high blood sugar such as being more thirsty or hungry or having to urinate more than normal. You may also feel very tired or have blurry vision.  signs and symptoms of infection like fever or chills; cough; sore throat; pain or trouble passing urine  slow growth in children (if used for longer periods of time)  swelling of ankles, feet  trouble sleeping  weak bones (if used for longer periods of time) Side effects that usually do not require medical attention (report to your doctor or health care professional if they continue or are bothersome):  nausea  skin problems, acne, thin and shiny skin  upset stomach  weight gain This list may not describe all possible side effects. Call your doctor for medical advice about side effects. You may report side effects to FDA at 1-800-FDA-1088. Where should I keep my medicine? Keep out of the reach of children. Store at room temperature  between 15 and 30 degrees C (59 and 86 degrees F). Keep container tightly closed. Throw away any  unused medicine after the expiration date. NOTE: This sheet is a summary. It may not cover all possible information. If you have questions about this medicine, talk to your doctor, pharmacist, or health care provider.  2020 Elsevier/Gold Standard (2018-04-22 10:30:56)

## 2019-07-15 ENCOUNTER — Other Ambulatory Visit: Payer: Self-pay

## 2019-07-15 DIAGNOSIS — Z20822 Contact with and (suspected) exposure to covid-19: Secondary | ICD-10-CM

## 2019-07-16 LAB — NOVEL CORONAVIRUS, NAA: SARS-CoV-2, NAA: NOT DETECTED

## 2019-09-21 ENCOUNTER — Other Ambulatory Visit: Payer: Self-pay | Admitting: Family Medicine

## 2019-09-21 DIAGNOSIS — R059 Cough, unspecified: Secondary | ICD-10-CM

## 2019-09-21 DIAGNOSIS — R05 Cough: Secondary | ICD-10-CM

## 2020-02-20 NOTE — Progress Notes (Signed)
Inetta Fermo Cummings,acting as a scribe for [name], MD.,have documented all relevant documentation on the behalf of [name], MD,as directed by  [name], MD while in the presence of [name], MD.   Established patient visit   Patient: Belinda Bentley   DOB: 04-11-1996   24 y.o. Female  MRN: [id number] Visit Date: 02/22/2020  Today's healthcare provider: [name], MD   Chief Complaint  Patient presents with  . Eczema  . pressure in leg   Subjective    HPI  Patient presents today because she has had some pressure in left leg that started about 2 weeks ago and she says that she is now noticing some numbness and tingling that she also noticed in her left arm. Patient says that at times she has had tingling in both legs and feet.   Patient would like have Dr. Sullivan Lone take a look at her eczema and she has been having some flare ups recently, says that today it is not as bad as It is on other days. Would like to see about having something prescribed.       Medications: Outpatient Medications Prior to Visit  Medication Sig  . cetirizine (ZYRTEC ALLERGY) 10 MG tablet Take 10 mg by mouth daily.  Marland Kitchen levonorgestrel-ethinyl estradiol (AVIANE,ALESSE,LESSINA) 0.1-20 MG-MCG tablet Take 1 tablet by mouth daily.  . montelukast (SINGULAIR) 10 MG tablet TAKE 1 TABLET BY MOUTH EVERY NIGHT AT BEDTIME  . [DISCONTINUED] azithromycin (ZITHROMAX) 250 MG tablet By mouth Take 2 tablets day 1 (500mg  total) and 1 tablet ( 250 mg ) on days 2,3,4,5. (Patient not taking: Reported on 02/22/2020)  . [DISCONTINUED] predniSONE (STERAPRED UNI-PAK 21 TAB) 10 MG (21) TBPK tablet PO: Take 6 tablets on day 1:Take 5 tablets day 2:Take 4 tablets day 3: Take 3 tablets day 4:Take 2 tablets day five: 5 Take 1 tablet day 6   No facility-administered medications prior to visit.    Review of Systems  Constitutional: Negative for appetite change and chills.  Respiratory:  Negative for chest tightness.   Cardiovascular: Negative for chest pain and palpitations.  Gastrointestinal: Negative for abdominal pain and nausea.  Neurological: Positive for numbness. Negative for dizziness, weakness and headaches.  Psychiatric/Behavioral: The patient is nervous/anxious.        Objective    BP 109/77 (BP Location: Right Arm, Patient Position: Sitting, Cuff Size: Large)   Pulse (!) 108   Temp (!) 97.3 F (36.3 C) (Temporal)   Ht 5\' 2"  (1.575 m)   Wt 131 lb (59.4 kg)   BMI 23.96 kg/m     Physical Exam Vitals reviewed.  Constitutional:      Appearance: Normal appearance.  HENT:     Head: Normocephalic and atraumatic.     Right Ear: External ear normal.     Left Ear: External ear normal.     Nose: Nose normal.     Mouth/Throat:     Pharynx: Oropharynx is clear.  Eyes:     General: No scleral icterus.    Conjunctiva/sclera: Conjunctivae normal.  Cardiovascular:     Rate and Rhythm: Normal rate and regular rhythm.     Pulses: Normal pulses.     Heart sounds: Normal heart sounds.  Pulmonary:     Effort: Pulmonary effort is normal.  Abdominal:     Palpations: Abdomen is soft.  Lymphadenopathy:     Cervical: No cervical adenopathy.  Skin:    General: Skin is  warm and dry.     Findings: No rash.  Neurological:     General: No focal deficit present.     Mental Status: She is alert and oriented to person, place, and time.     Cranial Nerves: No cranial nerve deficit.     Sensory: No sensory deficit.     Motor: No weakness.     Coordination: Coordination normal.     Gait: Gait normal.     Deep Tendon Reflexes: Reflexes normal.  Psychiatric:        Mood and Affect: Mood normal.        Behavior: Behavior normal.        Thought Content: Thought content normal.        Judgment: Judgment normal.       No results found for any visits on 02/22/20.  Assessment & Plan     1. Eczema, unspecified type No rash noted today. - CBC with  Differential/Platelet - TSH - Comprehensive metabolic panel - Hemoglobin A1c - Vitamin B12 - triamcinolone cream (KENALOG) 0.1 %; Apply 1 application topically 2 (two) times daily.  Dispense: 15 g; Refill: 1  2. Anxiety  This is the main source of this visit today.  Patient agrees.  Will follow clinically. - CBC with Differential/Platelet - TSH - Ambulatory referral to Neurology - Comprehensive metabolic panel - Hemoglobin A1c - Vitamin B12  3. Neuropathy Completely normal and nonfocal neurologic exam.  Further neurology for her peace of mind as she is worried she has MS. - CBC with Differential/Platelet - TSH - Ambulatory referral to Neurology - Comprehensive metabolic panel - Hemoglobin A1c - Vitamin B12   No follow-ups on file.         Ozro Russett Wendelyn Breslow, MD  [facility] [phone] (phone) [phone] (fax)  [facility]

## 2020-02-22 ENCOUNTER — Other Ambulatory Visit: Payer: Self-pay

## 2020-02-22 ENCOUNTER — Encounter: Payer: Self-pay | Admitting: Family Medicine

## 2020-02-22 ENCOUNTER — Ambulatory Visit (INDEPENDENT_AMBULATORY_CARE_PROVIDER_SITE_OTHER): Payer: Managed Care, Other (non HMO) | Admitting: Family Medicine

## 2020-02-22 VITALS — BP 109/77 | HR 108 | Temp 97.3°F | Ht 62.0 in | Wt 131.0 lb

## 2020-02-22 DIAGNOSIS — L309 Dermatitis, unspecified: Secondary | ICD-10-CM | POA: Diagnosis not present

## 2020-02-22 DIAGNOSIS — F419 Anxiety disorder, unspecified: Secondary | ICD-10-CM | POA: Diagnosis not present

## 2020-02-22 DIAGNOSIS — G629 Polyneuropathy, unspecified: Secondary | ICD-10-CM | POA: Diagnosis not present

## 2020-02-22 MED ORDER — TRIAMCINOLONE ACETONIDE 0.1 % EX CREA: 1.0000 "application " | TOPICAL_CREAM | Freq: Two times a day (BID) | CUTANEOUS | 1 refills | Status: AC

## 2020-02-23 ENCOUNTER — Telehealth: Payer: Self-pay

## 2020-02-23 LAB — CBC WITH DIFFERENTIAL/PLATELET
Basophils Absolute: 0 10*3/uL (ref 0.0–0.2)
Basos: 1 %
EOS (ABSOLUTE): 0.2 10*3/uL (ref 0.0–0.4)
Eos: 3 %
Hematocrit: 37.5 % (ref 34.0–46.6)
Hemoglobin: 12.5 g/dL (ref 11.1–15.9)
Immature Grans (Abs): 0 10*3/uL (ref 0.0–0.1)
Immature Granulocytes: 0 %
Lymphocytes Absolute: 1.9 10*3/uL (ref 0.7–3.1)
Lymphs: 38 %
MCH: 28.9 pg (ref 26.6–33.0)
MCHC: 33.3 g/dL (ref 31.5–35.7)
MCV: 87 fL (ref 79–97)
Monocytes Absolute: 0.4 10*3/uL (ref 0.1–0.9)
Monocytes: 8 %
Neutrophils Absolute: 2.5 10*3/uL (ref 1.4–7.0)
Neutrophils: 50 %
Platelets: 180 10*3/uL (ref 150–450)
RBC: 4.32 x10E6/uL (ref 3.77–5.28)
RDW: 12.6 % (ref 11.7–15.4)
WBC: 5.1 10*3/uL (ref 3.4–10.8)

## 2020-02-23 LAB — HEMOGLOBIN A1C
Est. average glucose Bld gHb Est-mCnc: 105 mg/dL
Hgb A1c MFr Bld: 5.3 % (ref 4.8–5.6)

## 2020-02-23 LAB — COMPREHENSIVE METABOLIC PANEL
ALT: 9 IU/L (ref 0–32)
AST: 15 IU/L (ref 0–40)
Albumin/Globulin Ratio: 2.1 (ref 1.2–2.2)
Albumin: 4.7 g/dL (ref 3.9–5.0)
Alkaline Phosphatase: 79 IU/L (ref 48–121)
BUN/Creatinine Ratio: 13 (ref 9–23)
BUN: 9 mg/dL (ref 6–20)
Bilirubin Total: <0.2 mg/dL (ref 0.0–1.2)
CO2: 22 mmol/L (ref 20–29)
Calcium: 9.2 mg/dL (ref 8.7–10.2)
Chloride: 105 mmol/L (ref 96–106)
Creatinine, Ser: 0.69 mg/dL (ref 0.57–1.00)
GFR calc Af Amer: 140 mL/min/{1.73_m2} (ref >59)
GFR calc non Af Amer: 121 mL/min/{1.73_m2} (ref >59)
Globulin, Total: 2.2 g/dL (ref 1.5–4.5)
Glucose: 104 mg/dL — ABNORMAL HIGH (ref 65–99)
Potassium: 3.9 mmol/L (ref 3.5–5.2)
Sodium: 139 mmol/L (ref 134–144)
Total Protein: 6.9 g/dL (ref 6.0–8.5)

## 2020-02-23 LAB — TSH: TSH: 1.27 u[IU]/mL (ref 0.450–4.500)

## 2020-02-23 LAB — VITAMIN B12: Vitamin B-12: 385 pg/mL (ref 232–1245)

## 2020-02-23 NOTE — Telephone Encounter (Signed)
-----   Message from Maple Hudson., MD sent at 02/23/2020  1:01 PM EDT ----- Labs all in normal range.

## 2020-02-23 NOTE — Telephone Encounter (Signed)
Patient advised of lab results through mychart.  °

## 2020-04-23 NOTE — Progress Notes (Signed)
[facility]    Provider:  Dr Lucia Gaskins Requesting Provider: Maple Hudson.,* Primary Care Provider:  Maple Hudson., MD  CC:  worried she has MS and reporting neuropathic symptoms, anxiety   HPI:  Belinda Bentley is a 24 y.o. female here as requested by Maple Hudson.,* for neuropathy.  I reviewed Dr. Elisabeth Cara notes, patient reported at appointment in July of this year that she had pressure in the left leg that started 2 weeks prior and then started noticing some numbness and tingling in her left arms, she also reported at times she has tingling in her legs and feet.  She was also having eczema flareups.  It was also noted that patient was nervous/anxious.  Dr. Elisabeth Cara examination showed normal eyes, cardiovascular, pulmonary, abdominal, skin, neurologic findings.  Blood work was ordered including CBC, TSH, CMP, hemoglobin A1c, B12 and anxiety was listed as a "main source" of visit.  Neurologic exam was completely normal and nonfocal per notes, patient worried she has multiple sclerosis.Per notes, she has high anxiety, she is here to "rule out MS". She went to pcp and they examined her and did not think she had to worry about it. Symptoms started with pressure in the legs, then numbness in the legs, then tingling. If the air is on her leg she feels cool, no symptoms in over a month.   Patient started the appointment by saying she is anxious and starts crying. Anything health related her anxiety "sky rockets". In july she noticed some spider veins and pressure in the leg, tightness in the upper left leg, no numbness, tingling or weakness. Earlier this year she felt subjectively extra weak" in both arms, she just felt weak, she feels that was anxiety or burnout. Iron was ok. In all symptoms she had sudden onset and sudden resolution. She also had numbness and tingling in the knees down both legs bilaterally symmetrically. By the time she went to her doctor's  appointment the pressure was gone but the numbness and tingling and she had convinced herself she had MS, exam was normal, they tried to reassure her. If her legs are exposed to cold air then she feels cold. They lasted a few weeks for each and then resolved. She feels cold if cold air is blown on her. In the past never lost vision, had urine problems, had other focal neurologic symptoms, problems walking. She has episodes of "weird" vision. No other focal neurologic deficits, associated symptoms, inciting events or modifiable factors.  Reviewed notes, labs and imaging from outside physicians, which showed:  B12 385, CBC normal, CMP unremarkable, TSH normal, hemoglobin A1c 5.3  Review of Systems: Patient complains of symptoms per HPI as well as the following symptoms: anxiety, numbness, tingling, blurry vision, weakness. Pertinent negatives and positives per HPI. All others negative.   Social History   Socioeconomic History  . Marital status: Married    Spouse name: Not on file  . Number of children: Not on file  . Years of education: Not on file  . Highest education level: Not on file  Occupational History  . Occupation: Geophysicist/field seismologist  Tobacco Use  . Smoking status: Never Smoker  . Smokeless tobacco: Never Used  Vaping Use  . Vaping Use: Never used  Substance and Sexual Activity  . Alcohol use: Yes    Comment: "rarely, maybe once every three months"  . Drug use: Never  . Sexual activity: Not on file  Other Topics Concern  .  Not on file  Social History Narrative   Lives at home with husband and their dog   Right handed   Caffeine: 1 cup/day   Social Determinants of Health   Financial Resource Strain:   . Difficulty of Paying Living Expenses: Not on file  Food Insecurity:   . Worried About Programme researcher, broadcasting/film/video in the Last Year: Not on file  . Ran Out of Food in the Last Year: Not on file  Transportation Needs:   . Lack of Transportation (Medical): Not on file  . Lack  of Transportation (Non-Medical): Not on file  Physical Activity:   . Days of Exercise per Week: Not on file  . Minutes of Exercise per Session: Not on file  Stress:   . Feeling of Stress : Not on file  Social Connections:   . Frequency of Communication with Friends and Family: Not on file  . Frequency of Social Gatherings with Friends and Family: Not on file  . Attends Religious Services: Not on file  . Active Member of Clubs or Organizations: Not on file  . Attends Banker Meetings: Not on file  . Marital Status: Not on file  Intimate Partner Violence:   . Fear of Current or Ex-Partner: Not on file  . Emotionally Abused: Not on file  . Physically Abused: Not on file  . Sexually Abused: Not on file    Family History  Problem Relation Age of Onset  . Anxiety disorder Mother   . Asthma Father   . Allergies Father   . Clotting disorder Maternal Grandfather   . Cancer Paternal Grandmother        breast  . Allergies Brother   . Multiple sclerosis Neg Hx     Past Medical History:  Diagnosis Date  . Allergy   . Anxiety   . Asthma   . Eczema     Patient Active Problem List   Diagnosis Date Noted  . Multiple neurological symptoms 04/24/2020  . Anxiety 04/24/2020  . Paresthesias 04/24/2020  . Dysmenorrhea 08/14/2015    Past Surgical History:  Procedure Laterality Date  . MOUTH SURGERY  2018   ranula removal  . WISDOM TOOTH EXTRACTION  2014    Current Outpatient Medications  Medication Sig Dispense Refill  . cetirizine (ZYRTEC ALLERGY) 10 MG tablet Take 10 mg by mouth daily.    Marland Kitchen levonorgestrel-ethinyl estradiol (AVIANE,ALESSE,LESSINA) 0.1-20 MG-MCG tablet Take 1 tablet by mouth daily.    . montelukast (SINGULAIR) 10 MG tablet TAKE 1 TABLET BY MOUTH EVERY NIGHT AT BEDTIME 90 tablet 3  . OVER THE COUNTER MEDICATION Juice Plus    . triamcinolone cream (KENALOG) 0.1 % Apply 1 application topically 2 (two) times daily. (Patient taking differently: Apply 1  application topically 2 (two) times daily. Uses as needed.) 15 g 1  . VITAMIN D PO Take by mouth. Occasionally     No current facility-administered medications for this visit.    Allergies as of 04/24/2020 - Review Complete 02/22/2020  Allergen Reaction Noted  . Augmentin [amoxicillin-pot clavulanate] Nausea And Vomiting 01/25/2018  . Sulfa antibiotics Nausea And Vomiting 01/25/2018    Vitals: BP 135/74 (BP Location: Right Arm, Patient Position: Sitting)   Pulse (!) 106   Ht 5' 1.75" (1.568 m)   Wt 130 lb (59 kg)   BMI 23.97 kg/m  Last Weight:  Wt Readings from Last 1 Encounters:  04/24/20 130 lb (59 kg)   Last Height:   Ht Readings from  Last 1 Encounters:  04/24/20 5' 1.75" (1.568 m)     Physical exam: Exam: Gen: anxious, conversant, well nourised,  well groomed                     CV: tachycardic,regular,, no MRG. No Carotid Bruits. No peripheral edema, warm, nontender Eyes: Conjunctivae clear without exudates or hemorrhage  Neuro: Detailed Neurologic Exam  Speech:    Speech is normal; fluent and spontaneous with normal comprehension.  Cognition:    The patient is oriented to person, place, and time;     recent and remote memory intact;     language fluent;     normal attention, concentration,     fund of knowledge Cranial Nerves:    The pupils are equal, round, and reactive to light. The fundi are normal and spontaneous venous pulsations are present. Visual fields are full to finger confrontation. Extraocular movements are intact. Trigeminal sensation is intact and the muscles of mastication are normal. The face is symmetric. The palate elevates in the midline. Hearing intact. Voice is normal. Shoulder shrug is normal. The tongue has normal motion without fasciculations.   Coordination:    Normal finger to nose and heel to shin. Normal rapid alternating movements.   Gait:    Heel-toe and tandem gait are normal.   Motor Observation:    No asymmetry, no  atrophy, and no involuntary movements noted. Tone:    Normal muscle tone.    Posture:    Posture is normal. normal erect    Strength:    Strength is V/V in the upper and lower limbs.      Sensation: intact to LT     Reflex Exam:  DTR's:    Deep tendon reflexes in the upper and lower extremities are normal bilaterally.   Toes:    The toes are downgoing bilaterally.   Clonus:    Clonus is absent.    Assessment/Plan:  Lovely 24 year old with what appears to be significant anxiety. worried she has MS and reporting neuropathic symptoms, multiple neurologic symptoms, anxiety, just wants to make sure  B12 385, up to 10% of patients under 400 may have B12 deficiency, will recheck B12 with MMA.  Patient started the appointment by saying she is anxious and starts crying. Anything health related her anxiety "sky rockets". She needs to treat this anxiety, discussed and encouraged her, send to [facility]. Lovely young patient, she has good insight that this may all be anxiety.  No FHx of autoimmune disorder. I believe this is anxiety, I had a discussion with her and tried to reassure her, I highly encouraged her to see a psychiatrist and treat it. However given her multiple neurologic symptoms I don;t think it is unreasonable to check an MRI of the brain and cervical spine for MS or other. I did explain sometimes we find incidental findings and given her anxiety she may then become stressed about incidental issues. Dr. Epimenio Foot please to read.   Orders Placed This Encounter  Procedures  . MR BRAIN W WO CONTRAST  . MR CERVICAL SPINE W WO CONTRAST  . B12 and Folate Panel  . Methylmalonic acid, serum  . Ambulatory referral to Psychiatry   No orders of the defined types were placed in this encounter.   Cc: Maple Hudson.,*,   Naomie Dean, MD  [facility] [address] [place], [place] [address]  Phone [phone] Fax  [phone]

## 2020-04-24 ENCOUNTER — Other Ambulatory Visit: Payer: Self-pay

## 2020-04-24 ENCOUNTER — Encounter: Payer: Self-pay | Admitting: Neurology

## 2020-04-24 ENCOUNTER — Ambulatory Visit (INDEPENDENT_AMBULATORY_CARE_PROVIDER_SITE_OTHER): Payer: 59 | Admitting: Neurology

## 2020-04-24 VITALS — BP 135/74 | HR 106 | Ht 61.75 in | Wt 130.0 lb

## 2020-04-24 DIAGNOSIS — H539 Unspecified visual disturbance: Secondary | ICD-10-CM

## 2020-04-24 DIAGNOSIS — E538 Deficiency of other specified B group vitamins: Secondary | ICD-10-CM

## 2020-04-24 DIAGNOSIS — R200 Anesthesia of skin: Secondary | ICD-10-CM

## 2020-04-24 DIAGNOSIS — R2990 Unspecified symptoms and signs involving the nervous system: Secondary | ICD-10-CM

## 2020-04-24 DIAGNOSIS — R29898 Other symptoms and signs involving the musculoskeletal system: Secondary | ICD-10-CM

## 2020-04-24 DIAGNOSIS — F419 Anxiety disorder, unspecified: Secondary | ICD-10-CM | POA: Diagnosis not present

## 2020-04-24 DIAGNOSIS — R208 Other disturbances of skin sensation: Secondary | ICD-10-CM

## 2020-04-24 DIAGNOSIS — R202 Paresthesia of skin: Secondary | ICD-10-CM | POA: Diagnosis not present

## 2020-04-24 NOTE — Patient Instructions (Signed)
MRI of the brain and cervical spine [facility] referral [facility]

## 2020-04-25 ENCOUNTER — Telehealth: Payer: Self-pay | Admitting: Neurology

## 2020-04-25 NOTE — Telephone Encounter (Signed)
Dr. Epimenio Foot, this is an evaluation for MS (MRI brain, cervical spine) would you be kind enough to read these studies when they are completed? Thank you  (FYI Dr. Marjory Lies and Dr. Epimenio Foot I would like Dr. Pearlean Brownie to read these thank you)

## 2020-04-25 NOTE — Telephone Encounter (Signed)
no to the covid questions MR Brain w/wo contrast & MR Cervical spine w/wo contrast Dr. Lucia Gaskins [facility] Auth: [id number], [id number] (exp. 04/24/20 to 06/08/20) & [place] Nail: [id number] (exp. 04/24/20 to 07/23/20). Patient is scheduled at [facility] for Tuesday 05/01/20.

## 2020-05-01 ENCOUNTER — Ambulatory Visit (INDEPENDENT_AMBULATORY_CARE_PROVIDER_SITE_OTHER): Payer: 59

## 2020-05-01 ENCOUNTER — Other Ambulatory Visit: Payer: Self-pay

## 2020-05-01 DIAGNOSIS — H539 Unspecified visual disturbance: Secondary | ICD-10-CM | POA: Diagnosis not present

## 2020-05-01 DIAGNOSIS — R202 Paresthesia of skin: Secondary | ICD-10-CM | POA: Diagnosis not present

## 2020-05-01 DIAGNOSIS — R29898 Other symptoms and signs involving the musculoskeletal system: Secondary | ICD-10-CM

## 2020-05-01 DIAGNOSIS — R2990 Unspecified symptoms and signs involving the nervous system: Secondary | ICD-10-CM

## 2020-05-01 DIAGNOSIS — R200 Anesthesia of skin: Secondary | ICD-10-CM

## 2020-05-01 DIAGNOSIS — R208 Other disturbances of skin sensation: Secondary | ICD-10-CM

## 2020-05-01 LAB — B12 AND FOLATE PANEL
Folate: >20 ng/mL
Vitamin B-12: 472 pg/mL (ref 232–1245)

## 2020-05-01 LAB — METHYLMALONIC ACID, SERUM: Methylmalonic Acid: 173 nmol/L (ref 0–378)

## 2020-05-01 MED ORDER — GADOBENATE DIMEGLUMINE 529 MG/ML IV SOLN
10.0000 mL | Freq: Once | INTRAVENOUS | Status: AC | PRN
  Administered 2020-05-01: 10 mL via INTRAVENOUS

## 2020-05-02 ENCOUNTER — Telehealth: Payer: Self-pay | Admitting: Neurology

## 2020-05-02 NOTE — Telephone Encounter (Signed)
Thank you :)

## 2020-05-02 NOTE — Telephone Encounter (Signed)
Please let patient know that MRI of the brain and cervical spine were normal, thanks

## 2020-05-02 NOTE — Telephone Encounter (Signed)
I read the MRIs this afternoon.

## 2020-05-03 NOTE — Telephone Encounter (Signed)
Pt has messaged back in mychart in response to the results and verbalized appreciation.

## 2020-05-14 ENCOUNTER — Telehealth: Payer: Self-pay | Admitting: Neurology

## 2020-05-14 NOTE — Telephone Encounter (Signed)
[facility] called patient to schedule . On 05/11/2020 . Patient relayed he will call back to schedule . [phone] .  °

## 2020-05-17 ENCOUNTER — Encounter: Payer: Self-pay | Admitting: Family Medicine

## 2020-05-17 ENCOUNTER — Other Ambulatory Visit: Payer: Self-pay

## 2020-05-17 ENCOUNTER — Ambulatory Visit (INDEPENDENT_AMBULATORY_CARE_PROVIDER_SITE_OTHER): Payer: 59 | Admitting: Family Medicine

## 2020-05-17 VITALS — BP 117/65 | HR 104 | Ht 61.75 in | Wt 130.0 lb

## 2020-05-17 DIAGNOSIS — R202 Paresthesia of skin: Secondary | ICD-10-CM | POA: Diagnosis not present

## 2020-05-17 DIAGNOSIS — R2990 Unspecified symptoms and signs involving the nervous system: Secondary | ICD-10-CM | POA: Diagnosis not present

## 2020-05-17 DIAGNOSIS — F419 Anxiety disorder, unspecified: Secondary | ICD-10-CM

## 2020-05-17 MED ORDER — ESCITALOPRAM OXALATE 10 MG PO TABS: 10.0000 mg | ORAL_TABLET | Freq: Every day | ORAL | 0 refills | Status: DC

## 2020-05-17 NOTE — Progress Notes (Signed)
I,Belinda Bentley,acting as a scribe for Belinda Mans, MD.,have documented all relevant documentation on the behalf of Belinda Mans, MD,as directed by  Belinda Mans, MD while in the presence of Belinda Mans, MD.   Established patient visit   Patient: Belinda Bentley   DOB: Sep 05, 1995   24 y.o. Female  MRN: [id number] Visit Date: 05/17/2020  Today's healthcare provider: [name], MD   No chief complaint on file.  Subjective    HPI  Patient had full work-up by neurology including MRI of the brain and spinal cord which were negative and normal.  The neurologist told her she thought it was anxiety. Anxiety, Follow-up  She was last seen for anxiety 3 months ago. Changes made at last visit include; This is the main source of this visit today.  Patient agrees.  Will follow clinically.   She reports good compliance with treatment. She reports good tolerance of treatment. She is not having side effects.   She feels her anxiety is moderate and Unchanged since last visit.  Symptoms: Yes chest pain No difficulty concentrating  No dizziness No fatigue  No feelings of losing control No insomnia  No irritable No palpitations  No panic attacks Yes racing thoughts  No shortness of breath No sweating  No tremors/shakes    GAD-7 Results GAD-7 Generalized Anxiety Disorder Screening Tool 01/25/2018  1. Feeling Nervous, Anxious, or on Edge 1  2. Not Being Able to Stop or Control Worrying 1  3. Worrying Too Much About Different Things 1  4. Trouble Relaxing 0  5. Being So Restless it's Hard To Sit Still 0  6. Becoming Easily Annoyed or Irritable 1  7. Feeling Afraid As If Something Awful Might Happen 1  Total GAD-7 Score 5  Difficulty At Work, Home, or Getting  Along With Others? Not difficult at all    PHQ-9 Scores PHQ9 SCORE ONLY 01/25/2018  PHQ-9 Total Score 0     ---------------------------------------------------------------------------------------------------  Neuropathy From 02/22/2020-Completely normal and nonfocal neurologic exam.  Further neurology for her peace of mind as she is worried she has MS.      Medications: Outpatient Medications Prior to Visit  Medication Sig  . cetirizine (ZYRTEC ALLERGY) 10 MG tablet Take 10 mg by mouth daily.  Marland Kitchen levonorgestrel-ethinyl estradiol (AVIANE,ALESSE,LESSINA) 0.1-20 MG-MCG tablet Take 1 tablet by mouth daily.  . montelukast (SINGULAIR) 10 MG tablet TAKE 1 TABLET BY MOUTH EVERY NIGHT AT BEDTIME  . OVER THE COUNTER MEDICATION Juice Plus  . triamcinolone cream (KENALOG) 0.1 % Apply 1 application topically 2 (two) times daily. (Patient taking differently: Apply 1 application topically 2 (two) times daily. Uses as needed.)  . VITAMIN D PO Take by mouth. Occasionally   No facility-administered medications prior to visit.    Review of Systems  Constitutional: Negative for appetite change, chills, fatigue and fever.  Respiratory: Negative for chest tightness and shortness of breath.   Cardiovascular: Negative for chest pain and palpitations.  Gastrointestinal: Negative for abdominal pain, nausea and vomiting.  Neurological: Negative for dizziness and weakness.       Objective    There were no vitals taken for this visit. Wt Readings from Last 3 Encounters:  05/17/20 130 lb (59 kg)  04/24/20 130 lb (59 kg)  02/22/20 131 lb (59.4 kg)      Physical Exam Vitals reviewed.  Constitutional:      Appearance: Normal appearance.  HENT:     Head: Normocephalic and atraumatic.  Right Ear: External ear normal.     Left Ear: External ear normal.     Nose: Nose normal.     Mouth/Throat:     Pharynx: Oropharynx is clear.  Eyes:     General: No scleral icterus.    Conjunctiva/sclera: Conjunctivae normal.  Cardiovascular:     Rate and Rhythm: Normal rate and regular rhythm.     Pulses: Normal  pulses.     Heart sounds: Normal heart sounds.  Pulmonary:     Effort: Pulmonary effort is normal.  Abdominal:     Palpations: Abdomen is soft.  Lymphadenopathy:     Cervical: No cervical adenopathy.  Skin:    General: Skin is warm and dry.     Findings: No rash.  Neurological:     General: No focal deficit present.     Mental Status: She is alert and oriented to person, place, and time.     Cranial Nerves: No cranial nerve deficit.     Sensory: No sensory deficit.     Motor: No weakness.     Coordination: Coordination normal.     Gait: Gait normal.     Deep Tendon Reflexes: Reflexes normal.  Psychiatric:        Mood and Affect: Mood normal.        Behavior: Behavior normal.        Thought Content: Thought content normal.        Judgment: Judgment normal.       No results found for any visits on 05/17/20.  Assessment & Plan     1. Anxiety/GAD Mother has done well with this through the years.  Avoid benzodiazepines.  Follow-up 1 month - escitalopram (LEXAPRO) 10 MG tablet; Take 1 tablet (10 mg total) by mouth daily.  Dispense: 30 tablet; Refill: 0  2. Multiple neurological symptoms   3. Paresthesias    No follow-ups on file.         Kyian Obst Wendelyn Breslow, MD  [facility] [phone] (phone) [phone] (fax)  [facility]

## 2020-05-18 ENCOUNTER — Telehealth: Payer: Self-pay | Admitting: *Deleted

## 2020-05-18 NOTE — Telephone Encounter (Signed)
Patient was advised to stop montelukast. Patient wanted more details on side effects and also wanted to know if she can take Zyrtec instead?

## 2020-05-18 NOTE — Telephone Encounter (Addendum)
Called pt to advise her not to take montelukast with Lexapro. Due to side effects. No answer and vm.

## 2020-05-22 NOTE — Telephone Encounter (Signed)
Low risk but it might make anxiety worse.  If she can stop it I would like to try to stop the montelukast

## 2020-05-22 NOTE — Telephone Encounter (Signed)
Patient want to know if she can take this medication montelukast once a week would that be ok with DR Sullivan Lone also patient say that she is comfortable taking only 5 MG of escitalopram (LEXAPRO) 10 MG tablet so she cuts the pill in two. Please advise      Ph# [phone]

## 2020-05-23 NOTE — Telephone Encounter (Signed)
Patient was advised once weekly will be fine. Patient wanted Dr. Sullivan Lone to be aware she is taking Lexapro at 0.5 tablet.

## 2020-05-23 NOTE — Telephone Encounter (Signed)
Patient advised.

## 2020-05-23 NOTE — Telephone Encounter (Signed)
Yes thanks 

## 2020-06-19 ENCOUNTER — Encounter: Payer: Self-pay | Admitting: Family Medicine

## 2020-06-19 ENCOUNTER — Ambulatory Visit (INDEPENDENT_AMBULATORY_CARE_PROVIDER_SITE_OTHER): Payer: 59 | Admitting: Family Medicine

## 2020-06-19 ENCOUNTER — Other Ambulatory Visit: Payer: Self-pay

## 2020-06-19 VITALS — BP 113/77 | HR 101 | Temp 95.5°F | Resp 16 | Ht 62.0 in | Wt 127.0 lb

## 2020-06-19 DIAGNOSIS — F419 Anxiety disorder, unspecified: Secondary | ICD-10-CM

## 2020-06-19 DIAGNOSIS — R109 Unspecified abdominal pain: Secondary | ICD-10-CM

## 2020-06-19 LAB — POCT URINALYSIS DIPSTICK
Bilirubin, UA: NEGATIVE
Blood, UA: NEGATIVE
Glucose, UA: NEGATIVE
Ketones, UA: NEGATIVE
Leukocytes, UA: NEGATIVE
Nitrite, UA: NEGATIVE
Protein, UA: POSITIVE — AB
Spec Grav, UA: 1.02 (ref 1.010–1.025)
Urobilinogen, UA: 0.2 E.U./dL
pH, UA: 8.5 — AB (ref 5.0–8.0)

## 2020-06-19 NOTE — Progress Notes (Signed)
I,[name],acting as a scribe for [name], MD.,have documented all relevant documentation on the behalf of [name], MD,as directed by  [name], MD while in the presence of [name], MD.   Established patient visit   Patient: Belinda Bentley   DOB: Mar 08, 1996   24 y.o. Female  MRN: [id number] Visit Date: 06/19/2020  Today's healthcare provider: [name], MD   Chief Complaint  Patient presents with  . Anxiety  . Follow-up   Subjective    HPI  Husband accompanies patient today.  They both think that she is slightly improved from last month since starting Lexapro.  She is only taking half a tablet.  No side effects.  She is still worried about occasional left arm discomfort which is nondescript.  She also describes a vague tightness in her bilateral lower lateral ribs.  No flank pain no abdominal pain no thoracic back pain.  GI or GU symptoms. Anxiety, Follow-up  She was last seen for anxiety 1 months ago. Changes made at last visit include; Mother has done well with this through the years.  Avoid benzodiazepines.  Follow-up 1 month.   She reports good compliance with treatment. She reports good tolerance of treatment. She is not having side effects. none  She feels her anxiety is moderate and Improved since last visit.  Symptoms: No chest pain No difficulty concentrating  Yes dizziness No fatigue  No feelings of losing control No insomnia  No irritable No palpitations  No panic attacks No racing thoughts  No shortness of breath No sweating  No tremors/shakes    GAD-7 Results GAD-7 Generalized Anxiety Disorder Screening Tool 05/17/2020 01/25/2018  1. Feeling Nervous, Anxious, or on Edge 3 1  2. Not Being Able to Stop or Control Worrying 2 1  3. Worrying Too Much About Different Things 2 1  4. Trouble Relaxing 0 0  5. Being So Restless it's Hard To Sit Still 0 0  6. Becoming Easily Annoyed or Irritable 1 1  7.  Feeling Afraid As If Something Awful Might Happen 2 1  Total GAD-7 Score 10 5  Difficulty At Work, Home, or Getting  Along With Others? Somewhat difficult Not difficult at all    PHQ-9 Scores PHQ9 SCORE ONLY 05/17/2020 01/25/2018  PHQ-9 Total Score 1 0    --------------------------------------------------------------------       Medications: Outpatient Medications Prior to Visit  Medication Sig  . cetirizine (ZYRTEC ALLERGY) 10 MG tablet Take 10 mg by mouth daily.  Marland Kitchen escitalopram (LEXAPRO) 10 MG tablet Take 1 tablet (10 mg total) by mouth daily.  . montelukast (SINGULAIR) 10 MG tablet TAKE 1 TABLET BY MOUTH EVERY NIGHT AT BEDTIME  . Nutritional Supplements (JUICE PLUS FIBRE PO) Take 6 capsules by mouth daily.  Marland Kitchen triamcinolone cream (KENALOG) 0.1 % Apply 1 application topically 2 (two) times daily. (Patient taking differently: Apply 1 application topically 2 (two) times daily. Uses as needed.)  . VITAMIN D PO Take by mouth. Occasionally   No facility-administered medications prior to visit.    Review of Systems  Constitutional: Negative for appetite change, chills, fatigue and fever.  Respiratory: Negative for chest tightness and shortness of breath.   Cardiovascular: Negative for chest pain and palpitations.  Gastrointestinal: Negative for abdominal pain, nausea and vomiting.  Neurological: Negative for dizziness and weakness.       Objective    BP 113/77 (BP Location: Left Arm, Patient Position: Sitting, Cuff Size: Normal)  Pulse (!) 101   Temp (!) 95.5 F (35.3 C) (Oral)   Resp 16   Ht 5\' 2"  (1.575 m)   Wt 127 lb (57.6 kg)   SpO2 98%   BMI 23.23 kg/m  Wt Readings from Last 3 Encounters:  06/19/20 127 lb (57.6 kg)  05/17/20 130 lb (59 kg)  04/24/20 130 lb (59 kg)      Physical Exam Vitals reviewed.  Constitutional:      Appearance: Normal appearance.  HENT:     Head: Normocephalic and atraumatic.     Right Ear: External ear normal.     Left Ear:  External ear normal.     Nose: Nose normal.     Mouth/Throat:     Pharynx: Oropharynx is clear.  Eyes:     General: No scleral icterus.    Conjunctiva/sclera: Conjunctivae normal.  Cardiovascular:     Rate and Rhythm: Normal rate and regular rhythm.     Pulses: Normal pulses.     Heart sounds: Normal heart sounds.  Pulmonary:     Effort: Pulmonary effort is normal.     Breath sounds: Normal breath sounds.     Comments: She is not tender around her ribs or abdomen no CVAT Abdominal:     Palpations: Abdomen is soft.  Lymphadenopathy:     Cervical: No cervical adenopathy.  Skin:    General: Skin is warm and dry.     Findings: No rash.  Neurological:     General: No focal deficit present.     Mental Status: She is alert and oriented to person, place, and time.     Cranial Nerves: No cranial nerve deficit.     Sensory: No sensory deficit.     Motor: No weakness.     Coordination: Coordination normal.     Gait: Gait normal.     Deep Tendon Reflexes: Reflexes normal.  Psychiatric:        Mood and Affect: Mood normal.        Behavior: Behavior normal.        Thought Content: Thought content normal.        Judgment: Judgment normal.       No results found for any visits on 06/19/20.  Assessment & Plan     1. Anxiety Start taking whole dose of Lexapro 10 mg daily.  Have  husband to come in with patient in 2 months.  2. Flank pain/rib discomfort I think this is musculoskeletal.  Follow-up in 2 months - POCT urinalysis dipstick--Normal   Return in about 3 months (around 09/19/2020).         Kimsey Demaree 09/21/2020, MD  [facility] [phone] (phone) [phone] (fax)  [facility]

## 2020-06-19 NOTE — Patient Instructions (Signed)
Start taking whole dose of Lexapro 10 mg daily.

## 2020-06-20 ENCOUNTER — Encounter: Payer: Self-pay | Admitting: Family Medicine

## 2020-07-03 ENCOUNTER — Other Ambulatory Visit: Payer: Self-pay | Admitting: Family Medicine

## 2020-07-03 DIAGNOSIS — F419 Anxiety disorder, unspecified: Secondary | ICD-10-CM

## 2020-07-03 NOTE — Telephone Encounter (Signed)
Requested Prescriptions  Pending Prescriptions Disp Refills  . escitalopram (LEXAPRO) 10 MG tablet [Pharmacy Med Name: ESCITALOPRAM 10MG  TABLETS] 30 tablet 3    Sig: TAKE 1 TABLET(10 MG) BY MOUTH DAILY     Psychiatry:  Antidepressants - SSRI Passed - 07/03/2020  2:56 PM      Passed - Valid encounter within last 6 months    Recent Outpatient Visits          2 weeks ago Anxiety   [facility]., MD   1 month ago Anxiety   [facility]., MD   4 months ago Eczema, unspecified type   [facility]., MD   11 months ago History of strep pharyngitis   [facility] [place], [facility], FNP   1 year ago Strep pharyngitis   [facility], Trey Sailors      Future Appointments            In 2 months [place]., MD [facility], [facility]

## 2020-08-02 ENCOUNTER — Other Ambulatory Visit: Payer: Self-pay | Admitting: Family Medicine

## 2020-08-02 DIAGNOSIS — F419 Anxiety disorder, unspecified: Secondary | ICD-10-CM

## 2020-09-05 ENCOUNTER — Other Ambulatory Visit: Payer: 59

## 2020-09-05 ENCOUNTER — Ambulatory Visit: Payer: Self-pay | Admitting: *Deleted

## 2020-09-05 DIAGNOSIS — Z20822 Contact with and (suspected) exposure to covid-19: Secondary | ICD-10-CM

## 2020-09-05 NOTE — Telephone Encounter (Signed)
Please make visit virtual

## 2020-09-05 NOTE — Progress Notes (Signed)
MyChart Video Visit    Virtual Visit via Video Note   This visit type was conducted due to national recommendations for restrictions regarding the COVID-19 Pandemic (e.g. social distancing) in an effort to limit this patient's exposure and mitigate transmission in our community. This patient is at least at moderate risk for complications without adequate follow up. This format is felt to be most appropriate for this patient at this time. Physical exam was limited by quality of the video and audio technology used for the visit.   Patient location: Home Provider location: Office  I discussed the limitations of evaluation and management by telemedicine and the availability of in person appointments. The patient expressed understanding and agreed to proceed.  Patient: Belinda Bentley   DOB: [birth year]   25 y.o. Female  MRN: [id number] Visit Date: 09/06/2020  Today's healthcare provider: [name], MD   No chief complaint on file.  Subjective    HPI  Delightful 25 year old was scheduled for office follow-up for anxiety but developed sore throat and fatigue 2 days ago so got Covid tested last night and is still not back.  She thinks the sore throat is just due to weather changes and states it is not unusual for this to happen but she is unvaccinated and just wanted to be safe. The other issue that she brings up this morning is again she feels like the veins in the left arm prominent compared to the right.  I looked at this on her last visit and she had a normal exam.  She does however have a tightness in the arm when she feels her veins are more prominent.  Anxiety, Follow-up She states she felt better on the Lexapro but then was given a different generic manufacturer last time and does not feel quite as well this time. She was last seen for anxiety 2 months ago. Changes made at last visit include no medication changes. Continue Lexpro.    She reports excellent compliance with  treatment. She reports good tolerance of treatment. She is not having side effects.   She feels her anxiety is moderate and Improved since last visit.  Symptoms: No chest pain No difficulty concentrating  Yes dizziness Yes fatigue  No feelings of losing control No insomnia  No irritable No palpitations  No panic attacks No racing thoughts  No shortness of breath No sweating  No tremors/shakes    GAD-7 Results GAD-7 Generalized Anxiety Disorder Screening Tool 05/17/2020 01/25/2018  1. Feeling Nervous, Anxious, or on Edge 3 1  2. Not Being Able to Stop or Control Worrying 2 1  3. Worrying Too Much About Different Things 2 1  4. Trouble Relaxing 0 0  5. Being So Restless it's Hard To Sit Still 0 0  6. Becoming Easily Annoyed or Irritable 1 1  7. Feeling Afraid As If Something Awful Might Happen 2 1  Total GAD-7 Score 10 5  Difficulty At Work, Home, or Getting  Along With Others? Somewhat difficult Not difficult at all    PHQ-9 Scores PHQ9 SCORE ONLY 05/17/2020 01/25/2018  PHQ-9 Total Score 1 0        Medications: Outpatient Medications Prior to Visit  Medication Sig  . cetirizine (ZYRTEC ALLERGY) 10 MG tablet Take 10 mg by mouth daily.  Marland Kitchen escitalopram (LEXAPRO) 10 MG tablet TAKE 1 TABLET(10 MG) BY MOUTH DAILY  . montelukast (SINGULAIR) 10 MG tablet TAKE 1 TABLET BY MOUTH EVERY NIGHT AT BEDTIME  . Nutritional Supplements (  JUICE PLUS FIBRE PO) Take 6 capsules by mouth daily.  Marland Kitchen triamcinolone cream (KENALOG) 0.1 % Apply 1 application topically 2 (two) times daily. (Patient taking differently: Apply 1 application topically 2 (two) times daily. Uses as needed.)  . VITAMIN D PO Take by mouth. Occasionally   No facility-administered medications prior to visit.    Review of Systems     Objective    There were no vitals taken for this visit. Wt Readings from Last 3 Encounters:  06/19/20 127 lb (57.6 kg)  05/17/20 130 lb (59 kg)  04/24/20 130 lb (59 kg)      Physical  Exam  She is pleasant and alert and oriented.  She is speaking in complete sentences and answers questions appropriately.  Looking arm there is no obvious abnormality on the video visit.   Assessment & Plan     1. Anxiety Improved on Lexapro.  She will try to go back to the previous manufacturer on her next refill.  Unfortunately she was given a 90-day supply.  I will see her back in a month. For her vein complaint I am and have her take a baby aspirin daily until the next visit.  I really think this is a nonissue but she is concerned so may need vascular referral for her peace of mind.  2. Allergic rhinitis, unspecified seasonality, unspecified trigger Try Flonase. 3 proteinuria At your next visit.  May need labs.  No follow-ups on file.     I discussed the assessment and treatment plan with the patient. The patient was provided an opportunity to ask questions and all were answered. The patient agreed with the plan and demonstrated an understanding of the instructions.   The patient was advised to call back or seek an in-person evaluation if the symptoms worsen or if the condition fails to improve as anticipated.  I provided 14 minutes of non-face-to-face time during this encounter.  I, [name], MD, have reviewed all documentation for this visit. The documentation on 09/06/20 for the exam, diagnosis, procedures, and orders are all accurate and complete.   Cristo Ausburn Wendelyn Breslow, MD [facility] [phone] (phone) [phone] (fax)  [facility]

## 2020-09-05 NOTE — Telephone Encounter (Signed)
Per Dr. Sullivan Lone visit needs to be virtual.

## 2020-09-05 NOTE — Telephone Encounter (Signed)
Patient has appointment tomorrow- she started having some symptoms yesterday that she wanted PCP to be aware of in case he wants to change her visit to virtual:  Yesterday afternoon patient started feeling " run down". Throat feels raw. Appointment tomorrow is follow up- medication follow up. Patient was considering virtual visit- she was wanting PCP to look in her ears- she has still been having ear discomfort and some dizziness occasionally.  Advised patient to go ahead and schedule COVID testing for screening   Reason for Disposition . [1] COVID-19 infection suspected by caller or triager AND [2] mild symptoms (cough, fever, or others) AND [3] has not gotten tested yet  Answer Assessment - Initial Assessment Questions 1. COVID-19 DIAGNOSIS: "Who made your COVID-19 diagnosis?" "Was it confirmed by a positive lab test?" If not diagnosed by a HCP, ask "Are there lots of cases (community spread) where you live?" Note: See public health department website, if unsure.     No diagnosis- patient is having some mild symptoms that she wants to make provider aware of. 2. COVID-19 EXPOSURE: "Was there any known exposure to COVID before the symptoms began?" CDC Definition of close contact: within 6 feet (2 meters) for a total of 15 minutes or more over a 24-hour period.      No known exposure 3. ONSET: "When did the COVID-19 symptoms start?"      Yesterday afternoon 4. WORST SYMPTOM: "What is your worst symptom?" (e.g., cough, fever, shortness of breath, muscle aches)     Fatigue- throat feels raw 5. COUGH: "Do you have a cough?" If Yes, ask: "How bad is the cough?"       no 6. FEVER: "Do you have a fever?" If Yes, ask: "What is your temperature, how was it measured, and when did it start?"     99- ear 7. RESPIRATORY STATUS: "Describe your breathing?" (e.g., shortness of breath, wheezing, unable to speak)      No problems 8. BETTER-SAME-WORSE: "Are you getting better, staying the same or getting worse  compared to yesterday?"  If getting worse, ask, "In what way?"     Worse- new symptom 9. HIGH RISK DISEASE: "Do you have any chronic medical problems?" (e.g., asthma, heart or lung disease, weak immune system, obesity, etc.)     no  Protocols used: CORONAVIRUS (COVID-19) DIAGNOSED OR SUSPECTED-A-AH

## 2020-09-06 ENCOUNTER — Telehealth: Payer: Self-pay

## 2020-09-06 ENCOUNTER — Ambulatory Visit: Payer: Self-pay | Admitting: Family Medicine

## 2020-09-06 ENCOUNTER — Telehealth (INDEPENDENT_AMBULATORY_CARE_PROVIDER_SITE_OTHER): Payer: 59 | Admitting: Family Medicine

## 2020-09-06 DIAGNOSIS — R809 Proteinuria, unspecified: Secondary | ICD-10-CM

## 2020-09-06 DIAGNOSIS — J309 Allergic rhinitis, unspecified: Secondary | ICD-10-CM

## 2020-09-06 DIAGNOSIS — F419 Anxiety disorder, unspecified: Secondary | ICD-10-CM

## 2020-09-06 LAB — SARS-COV-2, NAA 2 DAY TAT

## 2020-09-06 LAB — NOVEL CORONAVIRUS, NAA: SARS-CoV-2, NAA: DETECTED — AB

## 2020-09-06 MED ORDER — FLUTICASONE PROPIONATE 50 MCG/ACT NA SUSP: 2.0000 | Freq: Every day | NASAL | 6 refills | Status: DC

## 2020-09-06 NOTE — Telephone Encounter (Signed)
Copied from CRM [id number]. Topic: General - Other >> Sep 06, 2020 12:58 PM Lyn Hollingshead D wrote: PT requesting a callback to go over her ather visit summary / baby aspirin question / please advise

## 2020-09-06 NOTE — Addendum Note (Signed)
Addended by: Anson [place] on: 09/06/2020 08:57 AM   Modules accepted: Orders

## 2020-09-06 NOTE — Telephone Encounter (Signed)
Advised patient that Dr Sullivan Lone does want her to start taking a baby aspirin.

## 2020-09-07 ENCOUNTER — Telehealth: Payer: Self-pay

## 2020-09-07 ENCOUNTER — Encounter (INDEPENDENT_AMBULATORY_CARE_PROVIDER_SITE_OTHER): Payer: Self-pay

## 2020-09-07 NOTE — Telephone Encounter (Signed)
Patient states that she is having a dry cough sometimes but not frequent. Patient has been advised on cough per protocol as follows:   If cough remains the same or better: continue to treat with over the counter medications. Hard candy or cough drops and drinking warm fluids. Adults can also use honey 2 tsp (10 ML) at bedtime.   HONEY IS NOT RECOMMENDED FOR INFANTS UNDER ONE.   If cough is becoming worse even with the use of over the counter medications and patient is not able to sleep at night, cough becomes productive with sputum that maybe yellow or green in color, contact PCP.

## 2020-10-03 NOTE — Progress Notes (Signed)
Established patient visit   Patient: Belinda Bentley   DOB: Dec 21, 1995   25 y.o. Female  MRN: [id number] Visit Date: 10/04/2020  Today's healthcare provider: [name], MD   Chief Complaint  Patient presents with  . Anxiety   Subjective    HPI  She is doing better.  She is tolerating medication well.  She is having less concerns with health issues and left flank/left lower lateral rib pain and veins in her left antecubital space.. She also has to wash her hands a lot at work and has developed eczema of both hands and the proximal digit areas. She continues have some allergy problems and feels congested in her ears at times and feels spells of dizziness that are very nonspecific. Anxiety, Follow-up  She was last seen for anxiety 1 months ago. Changes made at last visit include continuing Lexapro.   She reports good compliance with treatment. She reports good tolerance of treatment. She is not having side effects.   She feels her anxiety is mild and Unchanged since last visit.  Symptoms: No chest pain No difficulty concentrating  No dizziness No fatigue  No feelings of losing control No insomnia  No irritable No palpitations  No panic attacks No racing thoughts  No shortness of breath No sweating  No tremors/shakes    GAD-7 Results GAD-7 Generalized Anxiety Disorder Screening Tool 10/04/2020 05/17/2020 01/25/2018  1. Feeling Nervous, Anxious, or on Edge 1 3 1   2. Not Being Able to Stop or Control Worrying 1 2 1   3. Worrying Too Much About Different Things 1 2 1   4. Trouble Relaxing 0 0 0  5. Being So Restless it's Hard To Sit Still 1 0 0  6. Becoming Easily Annoyed or Irritable 1 1 1   7. Feeling Afraid As If Something Awful Might Happen 1 2 1   Total GAD-7 Score 6 10 5   Difficulty At Work, Home, or Getting  Along With Others? Somewhat difficult Somewhat difficult Not difficult at all    PHQ-9 Scores PHQ9 SCORE ONLY 10/04/2020 05/17/2020 01/25/2018  PHQ-9  Total Score 0 1 0      Medications: Outpatient Medications Prior to Visit  Medication Sig  . cetirizine (ZYRTEC) 10 MG tablet Take 10 mg by mouth daily.  escitalopram (LEXAPRO) 10 MG tablet TAKE 1 TABLET(10 MG) BY MOUTH DAILY  . fluticasone (FLONASE) 50 MCG/ACT nasal spray Place 2 sprays into both nostrils daily.  . Nutritional Supplements (JUICE PLUS FIBRE PO) Take 6 capsules by mouth daily.  triamcinolone cream (KENALOG) 0.1 % Apply 1 application topically 2 (two) times daily. (Patient taking differently: Apply 1 application topically 2 (two) times daily. Uses as needed.)  . VITAMIN D PO Take by mouth. Occasionally  . montelukast (SINGULAIR) 10 MG tablet TAKE 1 TABLET BY MOUTH EVERY NIGHT AT BEDTIME (Patient not taking: Reported on 10/04/2020)   No facility-administered medications prior to visit.    Review of Systems  Constitutional: Negative for appetite change, chills, fatigue and fever.  Respiratory: Negative for chest tightness and shortness of breath.   Cardiovascular: Negative for chest pain and palpitations.  Gastrointestinal: Negative for abdominal pain, nausea and vomiting.  Neurological: Negative for dizziness and weakness.       Objective    BP 112/69   Pulse 82   Temp 98.4 F (36.9 C)   Resp 16   Ht 5' 1.75" (1.568 m)   Wt 130 lb (59 kg)   BMI 23.97  kg/m     Physical Exam Vitals reviewed.  Constitutional:      Appearance: Normal appearance.  HENT:     Head: Normocephalic and atraumatic.     Right Ear: External ear normal.     Left Ear: External ear normal.     Nose: Nose normal.     Mouth/Throat:     Pharynx: Oropharynx is clear.  Eyes:     General: No scleral icterus.    Conjunctiva/sclera: Conjunctivae normal.  Cardiovascular:     Rate and Rhythm: Normal rate and regular rhythm.     Pulses: Normal pulses.     Heart sounds: Normal heart sounds.  Pulmonary:     Effort: Pulmonary effort is normal.  Abdominal:     Palpations: Abdomen is  soft.  Lymphadenopathy:     Cervical: No cervical adenopathy.  Skin:    General: Skin is warm and dry.     Findings: No rash.     Comments: She has eczematous changes of the proximal digits and point between the fingers of her hands  Neurological:     General: No focal deficit present.     Mental Status: She is alert and oriented to person, place, and time.     Cranial Nerves: No cranial nerve deficit.     Sensory: No sensory deficit.     Motor: No weakness.     Coordination: Coordination normal.     Gait: Gait normal.     Deep Tendon Reflexes: Reflexes normal.  Psychiatric:        Mood and Affect: Mood normal.        Behavior: Behavior normal.        Thought Content: Thought content normal.        Judgment: Judgment normal.       No results found for any visits on 10/04/20.  Assessment & Plan     1.  left flank pain Patient is anxious about this possibly being a medical issue.  Will obtain chest x-ray and abdominal ultrasound.  Like to get a look at her left lateral chest and her spleen and kidney on that side.  I do not think a CT is indicated - US Abdomen Complete  2. Left-sided chest wall pain -X-ray as above. - DG Chest 2 View  3. Anxiety I think most of her symptoms are from anxiety.  Continue Lexapro at present dose I think the issue with the veins in her arm is normal variant.  4. Dizziness Nonspecific neurologic issue which could be  aggravated by allergies.  5. Allergic rhinitis, unspecified seasonality, unspecified trigger On antihistamine and nasal spray  6. Eczema, unspecified type Patient has triamcinolone cream to use.  Hopefully at the end of the winter and as Covid does away she will have to wash her hands a little less.  Follow-up this summer for physical and lab work   No follow-ups on file.      I, [name], MD, have reviewed all documentation for this visit. The documentation on 10/04/20 for the exam, diagnosis, procedures, and  orders are all accurate and complete.    Jerimy Johanson Wendelyn Breslow, MD  [facility] [phone] (phone) [phone] (fax)  [facility]

## 2020-10-04 ENCOUNTER — Ambulatory Visit (INDEPENDENT_AMBULATORY_CARE_PROVIDER_SITE_OTHER): Payer: 59 | Admitting: Family Medicine

## 2020-10-04 ENCOUNTER — Ambulatory Visit
Admission: RE | Admit: 2020-10-04 | Discharge: 2020-10-04 | Disposition: A | Discharge status: Home / Self Care | Payer: 59 | Source: Ambulatory Visit | Attending: Family Medicine | Admitting: Family Medicine

## 2020-10-04 ENCOUNTER — Encounter: Payer: Self-pay | Admitting: Family Medicine

## 2020-10-04 ENCOUNTER — Other Ambulatory Visit: Payer: Self-pay

## 2020-10-04 VITALS — BP 112/69 | HR 82 | Temp 98.4°F | Resp 16 | Ht 61.75 in | Wt 130.0 lb

## 2020-10-04 DIAGNOSIS — R42 Dizziness and giddiness: Secondary | ICD-10-CM | POA: Diagnosis not present

## 2020-10-04 DIAGNOSIS — R109 Unspecified abdominal pain: Secondary | ICD-10-CM

## 2020-10-04 DIAGNOSIS — R0789 Other chest pain: Secondary | ICD-10-CM | POA: Diagnosis not present

## 2020-10-04 DIAGNOSIS — F419 Anxiety disorder, unspecified: Secondary | ICD-10-CM

## 2020-10-04 DIAGNOSIS — L309 Dermatitis, unspecified: Secondary | ICD-10-CM

## 2020-10-04 DIAGNOSIS — J309 Allergic rhinitis, unspecified: Secondary | ICD-10-CM

## 2020-10-04 DIAGNOSIS — R10A2 Flank pain, left side: Secondary | ICD-10-CM

## 2020-10-10 ENCOUNTER — Ambulatory Visit: Payer: 59

## 2020-10-12 ENCOUNTER — Ambulatory Visit
Admission: RE | Admit: 2020-10-12 | Discharge: 2020-10-12 | Disposition: A | Discharge status: Home / Self Care | Payer: 59 | Source: Ambulatory Visit | Attending: Family Medicine | Admitting: Family Medicine

## 2020-10-12 ENCOUNTER — Other Ambulatory Visit: Payer: Self-pay

## 2020-10-12 DIAGNOSIS — R109 Unspecified abdominal pain: Secondary | ICD-10-CM | POA: Insufficient documentation

## 2020-10-15 ENCOUNTER — Telehealth: Payer: Self-pay

## 2020-10-15 NOTE — Telephone Encounter (Signed)
Please review [place] results. Thanks!

## 2020-10-15 NOTE — Telephone Encounter (Signed)
Copied from CRM [id number]. Topic: General - Other >> Oct 15, 2020  8:47 AM Belinda Bentley A wrote: [facility] has made contact regarding patient's results for patient's abdominal ultrasound  The procedure was done on 10/12/20 and results read on 10/13/20 Please contact staff at the provided number for further disclosure of the results

## 2020-10-18 ENCOUNTER — Other Ambulatory Visit: Payer: Self-pay | Admitting: Family Medicine

## 2020-10-18 DIAGNOSIS — K862 Cyst of pancreas: Secondary | ICD-10-CM

## 2020-10-18 NOTE — Progress Notes (Signed)
MRI ordered

## 2020-10-18 NOTE — Progress Notes (Signed)
Pancreatic cyst

## 2020-10-24 ENCOUNTER — Encounter: Payer: Self-pay | Admitting: Family Medicine

## 2020-11-13 ENCOUNTER — Telehealth: Payer: Self-pay

## 2020-11-13 NOTE — Telephone Encounter (Signed)
Copied from CRM [id number]. Topic: General - Other >> Nov 13, 2020  8:17 AM Jaquita Rector A wrote: Reason for CRM: Patient called in wanting to speak to Maralyn Sago about the referral for a MRI say that it was denied for hospital but will be approved for other facility than a hospital. Please call patient at Ph# [phone]

## 2020-11-14 ENCOUNTER — Other Ambulatory Visit: Payer: Self-pay

## 2020-11-14 DIAGNOSIS — K862 Cyst of pancreas: Secondary | ICD-10-CM

## 2020-11-19 ENCOUNTER — Ambulatory Visit: Payer: 59

## 2020-11-21 NOTE — Telephone Encounter (Signed)
Kim with [facility] called in to inquire from Maralyn Sago of orders that needed correction and stated that  patient is scheduled to be seen on Monday 11/26/20. Order need to state without contrast but with MRCP can be faxed at Fax# [phone] Ph# [phone]

## 2020-12-03 ENCOUNTER — Telehealth: Payer: Self-pay

## 2020-12-03 NOTE — Telephone Encounter (Signed)
Copied from CRM [id number]. Topic: General - Other >> Dec 03, 2020  2:57 PM Marylen Ponto wrote: Reason for CRM: Junious Dresser with [facility] called to request order be resubmitted for MRI MRCP without contrast. Fax# [phone]  Cb# [phone]

## 2020-12-03 NOTE — Telephone Encounter (Signed)
ok 

## 2020-12-04 ENCOUNTER — Other Ambulatory Visit: Payer: Self-pay | Admitting: *Deleted

## 2020-12-04 ENCOUNTER — Telehealth: Payer: Self-pay | Admitting: Family Medicine

## 2020-12-04 DIAGNOSIS — K862 Cyst of pancreas: Secondary | ICD-10-CM

## 2020-12-04 NOTE — Telephone Encounter (Signed)
Reordered and faxed

## 2020-12-04 NOTE — Telephone Encounter (Signed)
Joni Reining with [facility] called saying they recd the correct order but the CPT does not match  S/B [id number].  Please refax

## 2020-12-04 NOTE — Telephone Encounter (Signed)
Inetta Fermo from [facility] called request an order for MRI abd w/o contrast. She states they will only do these with contrast if pt has already had one done w/o and they have the report Phone if needed is [phone] Fax [phone] Pt has an appointment tomorrow 12/05/20.If this is changed I need to know ASAP to get procedure code changed on auth

## 2020-12-05 ENCOUNTER — Telehealth: Payer: Self-pay | Admitting: Family Medicine

## 2020-12-05 ENCOUNTER — Other Ambulatory Visit: Payer: Self-pay | Admitting: *Deleted

## 2020-12-05 NOTE — Telephone Encounter (Signed)
Noted  

## 2020-12-05 NOTE — Telephone Encounter (Signed)
Belinda Bentley, from [facility], calling stating that the pt has a procedure scheduled for 12/19/20. She states that the authorization will be expired before the appt and insurance is requesting to have a new authorization written up. Please advise.      [phone]

## 2020-12-05 NOTE — Telephone Encounter (Signed)
Belinda Bentley states pt got earlier appt, so disregard this message

## 2020-12-05 NOTE — Telephone Encounter (Signed)
Darl Pikes with [facility] in [place] need to speak to nurse just to make sure that orders for patients upcoming procedure on 12/19/20 is correct. Please call Darl Pikes at Ph#  [phone]

## 2020-12-12 ENCOUNTER — Telehealth: Payer: Self-pay

## 2020-12-12 DIAGNOSIS — K862 Cyst of pancreas: Secondary | ICD-10-CM

## 2020-12-12 NOTE — Telephone Encounter (Signed)
I have spoken with the patient with her MRI results.  Please make referral to [facility] just to make sure there is no other intervention that is necessary for  pancreatic cyst on MRI.  Thank you.  Patient is aware of the referral

## 2020-12-12 NOTE — Telephone Encounter (Signed)
Referral placed.

## 2020-12-12 NOTE — Telephone Encounter (Signed)
Copied from CRM [id number]. Topic: General - Other >> Dec 12, 2020  8:30 AM Belinda Bentley A wrote: Reason for CRM: Patient called in to inform Dr Sullivan Lone that she need a call back about her MRI results from MRI done on 12/06/20. Say that she saw the results on her [facility] My Chart but does not understand them. Please call  Ph# [phone]

## 2020-12-12 NOTE — Telephone Encounter (Signed)
Please advise MRI results? Results are in careeverywhere.

## 2021-01-17 DIAGNOSIS — J302 Other seasonal allergic rhinitis: Secondary | ICD-10-CM | POA: Insufficient documentation

## 2021-02-07 ENCOUNTER — Encounter: Payer: Self-pay | Admitting: Gastroenterology

## 2021-02-07 ENCOUNTER — Ambulatory Visit (INDEPENDENT_AMBULATORY_CARE_PROVIDER_SITE_OTHER): Payer: 59 | Admitting: Gastroenterology

## 2021-02-07 VITALS — BP 124/72 | HR 87 | Temp 98.2°F | Ht 61.0 in | Wt 131.6 lb

## 2021-02-07 DIAGNOSIS — K862 Cyst of pancreas: Secondary | ICD-10-CM | POA: Diagnosis not present

## 2021-02-07 NOTE — Progress Notes (Signed)
Gastroenterology Consultation  Referring Provider:     Maple Hudson.,* Primary Care Physician:  Maple Hudson., MD Primary Gastroenterologist:  Dr. Servando Snare     Reason for Consultation:     Pancreatic cyst        HPI:   Belinda Bentley is a 25 y.o. y/o female referred for consultation & management of pancreatic cyst by Dr. Sullivan Lone, Leonette Monarch., MD. this patient comes in today after having imaging done for left side abdominal pain that showed:  IMPRESSION: 1. A 2.6 x 2.4 x 1.4 cm proximal pancreatic cystic lesion. Finding could represent a pancreatic neoplasm. Recommend pancreatic protocol for further evaluation. 2. Remaining of upper abdomen ultrasound is unremarkable.   These results will be called to the ordering clinician or representative by the Radiologist Assistant, and communication documented in the PACS or [employer].  It was recommended that the patient undergo further evaluation and she had a MRI of the liver at [facility] that showed:  IMPRESSION:  Cystic lesion likely arising from the proximal pancreatic body measures up to 1.9 cm and has an internal septation, but no enhancing components. Recommend contrast enhanced MRI in six months or EUS/FNA  Barbie Haggis White PaperRecommendations, 2017).   As noted the cyst had an internal septation. The patient states that the imaging was done because of abdominal pain that has since resolved and she is no longer having any abdominal pain.  Past Medical History:  Diagnosis Date   Allergy    Anxiety    Asthma    Eczema     Past Surgical History:  Procedure Laterality Date   MOUTH SURGERY  2018   ranula removal   WISDOM TOOTH EXTRACTION  2014    Prior to Admission medications   Medication Sig Start Date End Date Taking? Authorizing Provider  cetirizine (ZYRTEC) 10 MG tablet Take 10 mg by mouth daily.    [provider]  escitalopram (LEXAPRO) 10 MG tablet TAKE 1 TABLET(10 MG) BY MOUTH  DAILY 08/02/20   Maple Hudson., MD  fluticasone [facility]) 50 MCG/ACT nasal spray Place 2 sprays into both nostrils daily. 09/06/20   Maple Hudson., MD  montelukast (SINGULAIR) 10 MG tablet TAKE 1 TABLET BY MOUTH EVERY NIGHT AT BEDTIME Patient not taking: Reported on 10/04/2020 09/21/19   Maple Hudson., MD  Nutritional Supplements (JUICE PLUS FIBRE PO) Take 6 capsules by mouth daily.    [provider]  triamcinolone cream (KENALOG) 0.1 % Apply 1 application topically 2 (two) times daily. Patient taking differently: Apply 1 application topically 2 (two) times daily. Uses as needed. 02/22/20   Maple Hudson., MD  VITAMIN D PO Take by mouth. Occasionally    [provider]    Family History  Problem Relation Age of Onset   Anxiety disorder Mother    Asthma Father    Allergies Father    Clotting disorder Maternal Grandfather    Cancer Paternal Grandmother        breast   Allergies Brother    Multiple sclerosis Neg Hx      Social History   Tobacco Use   Smoking status: Never   Smokeless tobacco: Never  Vaping Use   Vaping Use: Never used  Substance Use Topics   Alcohol use: Yes    Comment: "rarely, maybe once every three months"   Drug use: Never    Allergies as of 02/07/2021 - Review Complete 10/04/2020  Allergen Reaction Noted   Augmentin [amoxicillin-pot clavulanate] Nausea And Vomiting 01/25/2018   Sulfa antibiotics Nausea And Vomiting 01/25/2018    Review of Systems:    All systems reviewed and negative except where noted in HPI.   Physical Exam:  There were no vitals taken for this visit. No LMP recorded. (Menstrual status: Oral contraceptives). General:   Alert,  Well-developed, well-nourished, pleasant and cooperative in NAD Head:  Normocephalic and atraumatic. Eyes:  Sclera clear, no icterus.   Conjunctiva pink. Neurologic:  Alert and oriented x3;  grossly normal neurologically. Skin:  Intact without significant  lesions or rashes.  No jaundice. Psych:  Alert and cooperative. Normal mood and affect.  Imaging Studies: No results found.  Assessment and Plan:   Belinda Bentley is a 25 y.o. y/o female who comes in today with a cyst found in the pancreas with an MRI showing septation and recommending an endoscopic ultrasound with a possible FNA.  The patient had abdominal discomfort that has since resolved.  She is not having any symptoms at the present time.  She has been told that she should undergo a endoscopic ultrasound for evaluation of this lesion. This lesion may represent a mucinous cystic neoplasm with possible malignant potential.  The patient has been explained that she will be set up for a endoscopic ultrasound and we'll follow-up at that time.    Belinda Minium, MD. Belinda Bentley    Note: This dictation was prepared with Dragon dictation along with smaller phrase technology. Any transcriptional errors that result from this process are unintentional.

## 2021-02-12 ENCOUNTER — Telehealth: Payer: Self-pay

## 2021-02-12 ENCOUNTER — Other Ambulatory Visit: Payer: Self-pay

## 2021-02-12 DIAGNOSIS — F419 Anxiety disorder, unspecified: Secondary | ICD-10-CM

## 2021-02-12 NOTE — Telephone Encounter (Signed)
Please review. Thanks!  

## 2021-02-12 NOTE — Addendum Note (Signed)
Addended by: Anson [place] on: 02/12/2021 11:30 AM   Modules accepted: Orders

## 2021-02-12 NOTE — Telephone Encounter (Signed)
[facility] faxed refill request for the following medications:  escitalopram (LEXAPRO) 10 MG tablet    Please advise.  

## 2021-02-12 NOTE — Telephone Encounter (Signed)
Request for EUS received and sent for review prior to scheduling. Risks of EUS reviewed and she would like to proceed with scheduling. EUS scheduled for 03/14/21. Instructions reviewed and copy sent to MyChart. Provided contact information for future questions.

## 2021-02-13 ENCOUNTER — Other Ambulatory Visit: Payer: Self-pay

## 2021-02-13 ENCOUNTER — Encounter: Payer: Self-pay | Admitting: Family Medicine

## 2021-02-13 ENCOUNTER — Ambulatory Visit (INDEPENDENT_AMBULATORY_CARE_PROVIDER_SITE_OTHER): Payer: 59 | Admitting: Family Medicine

## 2021-02-13 VITALS — BP 110/68 | HR 82 | Resp 16 | Ht 61.5 in | Wt 131.8 lb

## 2021-02-13 DIAGNOSIS — K862 Cyst of pancreas: Secondary | ICD-10-CM

## 2021-02-13 DIAGNOSIS — Z0000 Encounter for general adult medical examination without abnormal findings: Secondary | ICD-10-CM | POA: Diagnosis not present

## 2021-02-13 DIAGNOSIS — Z13220 Encounter for screening for lipoid disorders: Secondary | ICD-10-CM | POA: Diagnosis not present

## 2021-02-13 MED ORDER — ESCITALOPRAM OXALATE 10 MG PO TABS: 10.0000 mg | ORAL_TABLET | Freq: Every day | ORAL | 3 refills | Status: DC

## 2021-02-13 NOTE — Progress Notes (Signed)
Complete physical exam   Patient: Belinda Bentley   DOB: 1995-09-03   24 y.o. Female  MRN: [id number] Visit Date: 02/13/2021  Today's healthcare provider: [name], MD   Chief Complaint  Patient presents with   Annual Exam   Subjective     HPI  Belinda Bentley is a 25 y.o. female who presents today for a complete physical exam.  She reports consuming a general diet. Gym/ health club routine includes cardio. She generally feels well. She reports sleeping well. She does not have additional problems to discuss today.  Last Reported Pap-? Patient saw gynecologist on 01/17/2021, reported to Gyn that her pap smear was up to date. Patient reports today pap was 09/2019 Patient has procedure arranged through GI for cyst in her pancreas. Past Medical History:  Diagnosis Date   Allergy    Anxiety    Asthma    Eczema    Past Surgical History:  Procedure Laterality Date   MOUTH SURGERY  2018   ranula removal   WISDOM TOOTH EXTRACTION  2014   Social History   Socioeconomic History   Marital status: Married    Spouse name: Not on file   Number of children: Not on file   Years of education: Not on file   Highest education level: Not on file  Occupational History   Occupation: Geophysicist/field seismologist  Tobacco Use   Smoking status: Never   Smokeless tobacco: Never  Vaping Use   Vaping Use: Never used  Substance and Sexual Activity   Alcohol use: Yes    Comment: "rarely, maybe once every three months"   Drug use: Never   Sexual activity: Not on file  Other Topics Concern   Not on file  Social History Narrative   Lives at home with husband and their dog   Right handed   Caffeine: 1 cup/day   Social Determinants of Health   Financial Resource Strain: Not on file  Food Insecurity: Not on file  Transportation Needs: Not on file  Physical Activity: Not on file  Stress: Not on file  Social Connections: Not on file  Intimate Partner Violence: Not on file    Family Status  Relation Name Status   Mother  Alive   Father  Alive   MGF  Alive   PGM  Deceased   Brother half Alive   MGM  Alive   PGF  Alive   Brother half Alive   Neg Hx  (Not Specified)   Family History  Problem Relation Age of Onset   Anxiety disorder Mother    Asthma Father    Allergies Father    Clotting disorder Maternal Grandfather    Cancer Paternal Grandmother        breast   Allergies Brother    Multiple sclerosis Neg Hx    Allergies  Allergen Reactions   Amoxicillin-Pot Clavulanate Nausea And Vomiting    Other reaction(s): Vomiting   Banana Itching   Sulfa Antibiotics Nausea And Vomiting    Other reaction(s): Vomiting    Patient Care Team: Maple Hudson., MD as PCP - General (Family Medicine)   Medications: Outpatient Medications Prior to Visit  Medication Sig   escitalopram (LEXAPRO) 10 MG tablet TAKE 1 TABLET(10 MG) BY MOUTH DAILY   Nutritional Supplements (JUICE PLUS FIBRE PO) Take 6 capsules by mouth daily.   fluticasone (FLONASE) 50 MCG/ACT nasal spray Place 2 sprays into both nostrils daily. (Patient not taking: Reported on  02/13/2021)   triamcinolone cream (KENALOG) 0.1 % Apply 1 application topically 2 (two) times daily. (Patient not taking: Reported on 02/13/2021)   VITAMIN D PO Take by mouth. Occasionally (Patient not taking: Reported on 02/13/2021)   No facility-administered medications prior to visit.    Review of Systems  Cardiovascular:  Positive for palpitations.  Genitourinary:  Positive for flank pain and vaginal discharge.  Musculoskeletal:  Positive for neck pain.  Skin:  Positive for rash.  Allergic/Immunologic: Positive for environmental allergies.  Psychiatric/Behavioral:  The patient is nervous/anxious.   All other systems reviewed and are negative.  Last metabolic panel Lab Results  Component Value Date   GLUCOSE 104 (H) 02/22/2020   NA 139 02/22/2020   K 3.9 02/22/2020   CL 105 02/22/2020   CO2 22 02/22/2020    BUN 9 02/22/2020   CREATININE 0.69 02/22/2020   GFRNONAA 121 02/22/2020   GFRAA 140 02/22/2020   CALCIUM 9.2 02/22/2020   PROT 6.9 02/22/2020   ALBUMIN 4.7 02/22/2020   LABGLOB 2.2 02/22/2020   AGRATIO 2.1 02/22/2020   BILITOT <0.2 02/22/2020   ALKPHOS 79 02/22/2020   AST 15 02/22/2020   ALT 9 02/22/2020   Last lipids No results found for: CHOL, HDL, LDLCALC, LDLDIRECT, TRIG, CHOLHDL Last hemoglobin A1c Lab Results  Component Value Date   HGBA1C 5.3 02/22/2020   Last thyroid functions Lab Results  Component Value Date   TSH 1.270 02/22/2020      Objective    BP 110/68   Pulse 82   Resp 16   Ht 5' 1.5" (1.562 m)   Wt 131 lb 12.8 oz (59.8 kg)   LMP 01/12/2021 (Exact Date)   SpO2 100%   BMI 24.50 kg/m     Physical Exam Vitals reviewed.  Constitutional:      Appearance: Normal appearance.  HENT:     Head: Normocephalic and atraumatic.     Right Ear: External ear normal.     Left Ear: External ear normal.     Nose: Nose normal.     Mouth/Throat:     Pharynx: Oropharynx is clear.  Eyes:     General: No scleral icterus.    Conjunctiva/sclera: Conjunctivae normal.  Cardiovascular:     Rate and Rhythm: Normal rate and regular rhythm.     Pulses: Normal pulses.     Heart sounds: Normal heart sounds.  Pulmonary:     Effort: Pulmonary effort is normal.  Abdominal:     Palpations: Abdomen is soft.  Lymphadenopathy:     Cervical: No cervical adenopathy.  Skin:    General: Skin is warm and dry.     Findings: No rash.  Neurological:     General: No focal deficit present.     Mental Status: She is alert and oriented to person, place, and time.     Cranial Nerves: No cranial nerve deficit.     Sensory: No sensory deficit.     Motor: No weakness.     Coordination: Coordination normal.     Gait: Gait normal.     Deep Tendon Reflexes: Reflexes normal.  Psychiatric:        Mood and Affect: Mood normal.        Behavior: Behavior normal.        Thought  Content: Thought content normal.        Judgment: Judgment normal.      Last depression screening scores PHQ 2/9 Scores 02/13/2021 10/04/2020 05/17/2020  PHQ - 2  Score 0 0 0  PHQ- 9 Score 1 0 1   Last fall risk screening Fall Risk  01/25/2018  Falls in the past year? No   Last Audit-C alcohol use screening Alcohol Use Disorder Test (AUDIT) 02/13/2021  1. How often do you have a drink containing alcohol? 1  2. How many drinks containing alcohol do you have on a typical day when you are drinking? 0  3. How often do you have six or more drinks on one occasion? 0  AUDIT-C Score 1  Alcohol Brief Interventions/Follow-up -   A score of 3 or more in women, and 4 or more in men indicates increased risk for alcohol abuse, EXCEPT if all of the points are from question 1   No results found for any visits on 02/13/21.  Assessment & Plan    Routine Health Maintenance and Physical Exam  Exercise Activities and Dietary recommendations  Goals   None      There is no immunization history on file for this patient.  Health Maintenance  Topic Date Due   COVID-19 Vaccine (1) Never done   HPV VACCINES (1 - 2-dose series) Never done   HIV Screening  Never done   Hepatitis C Screening  Never done   PAP-Cervical Cytology Screening  Never done   PAP SMEAR-Modifier  Never done   TETANUS/TDAP  03/24/2019   INFLUENZA VACCINE  03/04/2021   Pneumococcal Vaccine 79-9 Years old  Aged Out    Discussed health benefits of physical activity, and encouraged her to engage in regular exercise appropriate for her age and condition.  1. Annual physical exam Well woman exam per GYN. - CBC with Differential/Platelet - Comprehensive metabolic panel - Lipid panel - TSH  2. Screening for lipid disorders  - Lipid panel  3. Pancreatic cyst Work-up and possible.diagnostic biopsy pending per GI.   No follow-ups on file.     I, [name], MD, have reviewed all documentation for this visit. The  documentation on 02/17/21 for the exam, diagnosis, procedures, and orders are all accurate and complete.    Belinda Bentley Wendelyn Breslow, MD  [facility] [phone] (phone) [phone] (fax)  [facility]

## 2021-02-13 NOTE — H&P (View-Only) (Signed)
Complete physical exam   Patient: Belinda Bentley   DOB: 1995-09-03   24 y.o. Female  MRN: [id number] Visit Date: 02/13/2021  Today's healthcare provider: [name], MD   Chief Complaint  Patient presents with   Annual Exam   Subjective     HPI  Belinda Bentley is a 25 y.o. female who presents today for a complete physical exam.  She reports consuming a general diet. Gym/ health club routine includes cardio. She generally feels well. She reports sleeping well. She does not have additional problems to discuss today.  Last Reported Pap-? Patient saw gynecologist on 01/17/2021, reported to Gyn that her pap smear was up to date. Patient reports today pap was 09/2019 Patient has procedure arranged through GI for cyst in her pancreas. Past Medical History:  Diagnosis Date   Allergy    Anxiety    Asthma    Eczema    Past Surgical History:  Procedure Laterality Date   MOUTH SURGERY  2018   ranula removal   WISDOM TOOTH EXTRACTION  2014   Social History   Socioeconomic History   Marital status: Married    Spouse name: Not on file   Number of children: Not on file   Years of education: Not on file   Highest education level: Not on file  Occupational History   Occupation: Geophysicist/field seismologist  Tobacco Use   Smoking status: Never   Smokeless tobacco: Never  Vaping Use   Vaping Use: Never used  Substance and Sexual Activity   Alcohol use: Yes    Comment: "rarely, maybe once every three months"   Drug use: Never   Sexual activity: Not on file  Other Topics Concern   Not on file  Social History Narrative   Lives at home with husband and their dog   Right handed   Caffeine: 1 cup/day   Social Determinants of Health   Financial Resource Strain: Not on file  Food Insecurity: Not on file  Transportation Needs: Not on file  Physical Activity: Not on file  Stress: Not on file  Social Connections: Not on file  Intimate Partner Violence: Not on file    Family Status  Relation Name Status   Mother  Alive   Father  Alive   MGF  Alive   PGM  Deceased   Brother half Alive   MGM  Alive   PGF  Alive   Brother half Alive   Neg Hx  (Not Specified)   Family History  Problem Relation Age of Onset   Anxiety disorder Mother    Asthma Father    Allergies Father    Clotting disorder Maternal Grandfather    Cancer Paternal Grandmother        breast   Allergies Brother    Multiple sclerosis Neg Hx    Allergies  Allergen Reactions   Amoxicillin-Pot Clavulanate Nausea And Vomiting    Other reaction(s): Vomiting   Banana Itching   Sulfa Antibiotics Nausea And Vomiting    Other reaction(s): Vomiting    Patient Care Team: Maple Hudson., MD as PCP - General (Family Medicine)   Medications: Outpatient Medications Prior to Visit  Medication Sig   escitalopram (LEXAPRO) 10 MG tablet TAKE 1 TABLET(10 MG) BY MOUTH DAILY   Nutritional Supplements (JUICE PLUS FIBRE PO) Take 6 capsules by mouth daily.   fluticasone (FLONASE) 50 MCG/ACT nasal spray Place 2 sprays into both nostrils daily. (Patient not taking: Reported on  02/13/2021)   triamcinolone cream (KENALOG) 0.1 % Apply 1 application topically 2 (two) times daily. (Patient not taking: Reported on 02/13/2021)   VITAMIN D PO Take by mouth. Occasionally (Patient not taking: Reported on 02/13/2021)   No facility-administered medications prior to visit.    Review of Systems  Cardiovascular:  Positive for palpitations.  Genitourinary:  Positive for flank pain and vaginal discharge.  Musculoskeletal:  Positive for neck pain.  Skin:  Positive for rash.  Allergic/Immunologic: Positive for environmental allergies.  Psychiatric/Behavioral:  The patient is nervous/anxious.   All other systems reviewed and are negative.  Last metabolic panel Lab Results  Component Value Date   GLUCOSE 104 (H) 02/22/2020   NA 139 02/22/2020   K 3.9 02/22/2020   CL 105 02/22/2020   CO2 22 02/22/2020    BUN 9 02/22/2020   CREATININE 0.69 02/22/2020   GFRNONAA 121 02/22/2020   GFRAA 140 02/22/2020   CALCIUM 9.2 02/22/2020   PROT 6.9 02/22/2020   ALBUMIN 4.7 02/22/2020   LABGLOB 2.2 02/22/2020   AGRATIO 2.1 02/22/2020   BILITOT <0.2 02/22/2020   ALKPHOS 79 02/22/2020   AST 15 02/22/2020   ALT 9 02/22/2020   Last lipids No results found for: CHOL, HDL, LDLCALC, LDLDIRECT, TRIG, CHOLHDL Last hemoglobin A1c Lab Results  Component Value Date   HGBA1C 5.3 02/22/2020   Last thyroid functions Lab Results  Component Value Date   TSH 1.270 02/22/2020      Objective    BP 110/68   Pulse 82   Resp 16   Ht 5' 1.5" (1.562 m)   Wt 131 lb 12.8 oz (59.8 kg)   LMP 01/12/2021 (Exact Date)   SpO2 100%   BMI 24.50 kg/m     Physical Exam Vitals reviewed.  Constitutional:      Appearance: Normal appearance.  HENT:     Head: Normocephalic and atraumatic.     Right Ear: External ear normal.     Left Ear: External ear normal.     Nose: Nose normal.     Mouth/Throat:     Pharynx: Oropharynx is clear.  Eyes:     General: No scleral icterus.    Conjunctiva/sclera: Conjunctivae normal.  Cardiovascular:     Rate and Rhythm: Normal rate and regular rhythm.     Pulses: Normal pulses.     Heart sounds: Normal heart sounds.  Pulmonary:     Effort: Pulmonary effort is normal.  Abdominal:     Palpations: Abdomen is soft.  Lymphadenopathy:     Cervical: No cervical adenopathy.  Skin:    General: Skin is warm and dry.     Findings: No rash.  Neurological:     General: No focal deficit present.     Mental Status: She is alert and oriented to person, place, and time.     Cranial Nerves: No cranial nerve deficit.     Sensory: No sensory deficit.     Motor: No weakness.     Coordination: Coordination normal.     Gait: Gait normal.     Deep Tendon Reflexes: Reflexes normal.  Psychiatric:        Mood and Affect: Mood normal.        Behavior: Behavior normal.        Thought  Content: Thought content normal.        Judgment: Judgment normal.      Last depression screening scores PHQ 2/9 Scores 02/13/2021 10/04/2020 05/17/2020  PHQ - 2  Score 0 0 0  PHQ- 9 Score 1 0 1   Last fall risk screening Fall Risk  01/25/2018  Falls in the past year? No   Last Audit-C alcohol use screening Alcohol Use Disorder Test (AUDIT) 02/13/2021  1. How often do you have a drink containing alcohol? 1  2. How many drinks containing alcohol do you have on a typical day when you are drinking? 0  3. How often do you have six or more drinks on one occasion? 0  AUDIT-C Score 1  Alcohol Brief Interventions/Follow-up -   A score of 3 or more in women, and 4 or more in men indicates increased risk for alcohol abuse, EXCEPT if all of the points are from question 1   No results found for any visits on 02/13/21.  Assessment & Plan    Routine Health Maintenance and Physical Exam  Exercise Activities and Dietary recommendations  Goals   None      There is no immunization history on file for this patient.  Health Maintenance  Topic Date Due   COVID-19 Vaccine (1) Never done   HPV VACCINES (1 - 2-dose series) Never done   HIV Screening  Never done   Hepatitis C Screening  Never done   PAP-Cervical Cytology Screening  Never done   PAP SMEAR-Modifier  Never done   TETANUS/TDAP  03/24/2019   INFLUENZA VACCINE  03/04/2021   Pneumococcal Vaccine 79-9 Years old  Aged Out    Discussed health benefits of physical activity, and encouraged her to engage in regular exercise appropriate for her age and condition.  1. Annual physical exam Well woman exam per GYN. - CBC with Differential/Platelet - Comprehensive metabolic panel - Lipid panel - TSH  2. Screening for lipid disorders  - Lipid panel  3. Pancreatic cyst Work-up and possible.diagnostic biopsy pending per GI.   No follow-ups on file.     I, [name], MD, have reviewed all documentation for this visit. The  documentation on 02/17/21 for the exam, diagnosis, procedures, and orders are all accurate and complete.    Dierre Crevier Wendelyn Breslow, MD  [facility] [phone] (phone) [phone] (fax)  [facility]

## 2021-02-13 NOTE — Patient Instructions (Signed)
Health Maintenance, Female Adopting a healthy lifestyle and getting preventive care are important in promoting health and wellness. Ask your health care provider about: The right schedule for you to have regular tests and exams. Things you can do on your own to prevent diseases and keep yourself healthy. What should I know about diet, weight, and exercise? Eat a healthy diet  Eat a diet that includes plenty of vegetables, fruits, low-fat dairy products, and lean protein. Do not eat a lot of foods that are high in solid fats, added sugars, or sodium.  Maintain a healthy weight Body mass index (BMI) is used to identify weight problems. It estimates body fat based on height and weight. Your health care provider can help determineyour BMI and help you achieve or maintain a healthy weight. Get regular exercise Get regular exercise. This is one of the most important things you can do for your health. Most adults should: Exercise for at least 150 minutes each week. The exercise should increase your heart rate and make you sweat (moderate-intensity exercise). Do strengthening exercises at least twice a week. This is in addition to the moderate-intensity exercise. Spend less time sitting. Even light physical activity can be beneficial. Watch cholesterol and blood lipids Have your blood tested for lipids and cholesterol at 27 years of age, then havethis test every 5 years. Have your cholesterol levels checked more often if: Your lipid or cholesterol levels are high. You are older than 27 years of age. You are at high risk for heart disease. What should I know about cancer screening? Depending on your health history and family history, you may need to have cancer screening at various ages. This may include screening for: Breast cancer. Cervical cancer. Colorectal cancer. Skin cancer. Lung cancer. What should I know about heart disease, diabetes, and high blood pressure? Blood pressure and heart  disease High blood pressure causes heart disease and increases the risk of stroke. This is more likely to develop in people who have high blood pressure readings, are of African descent, or are overweight. Have your blood pressure checked: Every 3-5 years if you are 18-39 years of age. Every year if you are 40 years old or older. Diabetes Have regular diabetes screenings. This checks your fasting blood sugar level. Have the screening done: Once every three years after age 40 if you are at a normal weight and have a low risk for diabetes. More often and at a younger age if you are overweight or have a high risk for diabetes. What should I know about preventing infection? Hepatitis B If you have a higher risk for hepatitis B, you should be screened for this virus. Talk with your health care provider to find out if you are at risk forhepatitis B infection. Hepatitis C Testing is recommended for: Everyone born from 1945 through 1965. Anyone with known risk factors for hepatitis C. Sexually transmitted infections (STIs) Get screened for STIs, including gonorrhea and chlamydia, if: You are sexually active and are younger than 27 years of age. You are older than 27 years of age and your health care provider tells you that you are at risk for this type of infection. Your sexual activity has changed since you were last screened, and you are at increased risk for chlamydia or gonorrhea. Ask your health care provider if you are at risk. Ask your health care provider about whether you are at high risk for HIV. Your health care provider may recommend a prescription medicine to help   prevent HIV infection. If you choose to take medicine to prevent HIV, you should first get tested for HIV. You should then be tested every 3 months for as long as you are taking the medicine. Pregnancy If you are about to stop having your period (premenopausal) and you may become pregnant, seek counseling before you get  pregnant. Take 400 to 800 micrograms (mcg) of folic acid every day if you become pregnant. Ask for birth control (contraception) if you want to prevent pregnancy. Osteoporosis and menopause Osteoporosis is a disease in which the bones lose minerals and strength with aging. This can result in bone fractures. If you are 65 years old or older, or if you are at risk for osteoporosis and fractures, ask your health care provider if you should: Be screened for bone loss. Take a calcium or vitamin D supplement to lower your risk of fractures. Be given hormone replacement therapy (HRT) to treat symptoms of menopause. Follow these instructions at home: Lifestyle Do not use any products that contain nicotine or tobacco, such as cigarettes, e-cigarettes, and chewing tobacco. If you need help quitting, ask your health care provider. Do not use street drugs. Do not share needles. Ask your health care provider for help if you need support or information about quitting drugs. Alcohol use Do not drink alcohol if: Your health care provider tells you not to drink. You are pregnant, may be pregnant, or are planning to become pregnant. If you drink alcohol: Limit how much you use to 0-1 drink a day. Limit intake if you are breastfeeding. Be aware of how much alcohol is in your drink. In the [place] one drink equals one 12 oz bottle of beer (355 mL), one 5 oz glass of wine (148 mL), or one 1 oz glass of hard liquor (44 mL). General instructions Schedule regular health, dental, and eye exams. Stay current with your vaccines. Tell your health care provider if: You often feel depressed. You have ever been abused or do not feel safe at home. Summary Adopting a healthy lifestyle and getting preventive care are important in promoting health and wellness. Follow your health care provider's instructions about healthy diet, exercising, and getting tested or screened for diseases. Follow your health care provider's  instructions on monitoring your cholesterol and blood pressure. This information is not intended to replace advice given to you by your health care provider. Make sure you discuss any questions you have with your healthcare provider. Document Revised: 07/14/2018 Document Reviewed: 07/14/2018 Elsevier Patient Education  2022 Elsevier Inc.  Breast Self-Awareness Breast self-awareness is knowing how your breasts look and feel. Doing breast self-awareness is important. It allows you to catch a breast problem early while it is still small and can be treated. All women should do breast self-awareness, including women who have had breast implants. Tell your doctorif you notice a change in your breasts. What you need: A mirror. A well-lit room. How to do a breast self-exam A breast self-exam is one way to learn what is normal for your breasts and tocheck for changes. To do a breast self-exam: Look for changes  Take off all the clothes above your waist. Stand in front of a mirror in a room with good lighting. Put your hands on your hips. Push your hands down. Look at your breasts and nipples in the mirror to see if one breast or nipple looks different from the other. Check to see if: The shape of one breast is different. The size of   one breast is different. There are wrinkles, dips, and bumps in one breast and not the other. Look at each breast for changes in the skin, such as: Redness. Scaly areas. Look for changes in your nipples, such as: Liquid around the nipples. Bleeding. Dimpling. Redness. A change in where the nipples are.  Feel for changes  Lie on your back on the floor. Feel each breast. To do this, follow these steps: Pick a breast to feel. Put the arm closest to that breast above your head. Use your other arm to feel the nipple area of your breast. Feel the area with the pads of your three middle fingers by making small circles with your fingers. For the first circle, press  lightly. For the second circle, press harder. For the third circle, press even harder. Keep making circles with your fingers at the different pressures as you move down your breast. Stop when you feel your ribs. Move your fingers a little toward the center of your body. Start making circles with your fingers again, this time going up until you reach your collarbone. Keep making up-and-down circles until you reach your armpit. Remember to keep using the three pressures. Feel the other breast in the same way. Sit or stand in the tub or shower. With soapy water on your skin, feel each breast the same way you did in step 2 when you were lying on the floor.  Write down what you find Writing down what you find can help you remember what to tell your doctor. Write down: What is normal for each breast. Any changes you find in each breast, including: The kind of changes you find. Whether you have pain. Size and location of any lumps. When you last had your menstrual period. General tips Check your breasts every month. If you are breastfeeding, the best time to check your breasts is after you feed your baby or after you use a breast pump. If you get menstrual periods, the best time to check your breasts is 5-7 days after your menstrual period is over. With time, you will become comfortable with the self-exam, and you will begin to know if there are changes in your breasts. Contact a doctor if you: See a change in the shape or size of your breasts or nipples. See a change in the skin of your breast or nipples, such as red or scaly skin. Have fluid coming from your nipples that is not normal. Find a lump or thick area that was not there before. Have pain in your breasts. Have any concerns about your breast health. Summary Breast self-awareness includes looking for changes in your breasts, as well as feeling for changes within your breasts. Breast self-awareness should be done in front of a mirror  in a well-lit room. You should check your breasts every month. If you get menstrual periods, the best time to check your breasts is 5-7 days after your menstrual period is over. Let your doctor know of any changes you see in your breasts, including changes in size, changes on the skin, pain or tenderness, or fluid from your nipples that is not normal. This information is not intended to replace advice given to you by your health care provider. Make sure you discuss any questions you have with your healthcare provider. Document Revised: 03/09/2018 Document Reviewed: 03/09/2018 Elsevier Patient Education  2022 Elsevier Inc.  

## 2021-02-14 LAB — CBC WITH DIFFERENTIAL/PLATELET
Basophils Absolute: 0 10*3/uL (ref 0.0–0.2)
Basos: 1 %
EOS (ABSOLUTE): 0.2 10*3/uL (ref 0.0–0.4)
Eos: 3 %
Hematocrit: 36.9 % (ref 34.0–46.6)
Hemoglobin: 12.6 g/dL (ref 11.1–15.9)
Immature Grans (Abs): 0 10*3/uL (ref 0.0–0.1)
Immature Granulocytes: 0 %
Lymphocytes Absolute: 1.8 10*3/uL (ref 0.7–3.1)
Lymphs: 38 %
MCH: 28.9 pg (ref 26.6–33.0)
MCHC: 34.1 g/dL (ref 31.5–35.7)
MCV: 85 fL (ref 79–97)
Monocytes Absolute: 0.4 10*3/uL (ref 0.1–0.9)
Monocytes: 8 %
Neutrophils Absolute: 2.4 10*3/uL (ref 1.4–7.0)
Neutrophils: 50 %
Platelets: 194 10*3/uL (ref 150–450)
RBC: 4.36 x10E6/uL (ref 3.77–5.28)
RDW: 12.4 % (ref 11.7–15.4)
WBC: 4.8 10*3/uL (ref 3.4–10.8)

## 2021-02-14 LAB — COMPREHENSIVE METABOLIC PANEL
ALT: 78 IU/L — ABNORMAL HIGH (ref 0–32)
AST: 48 IU/L — ABNORMAL HIGH (ref 0–40)
Albumin/Globulin Ratio: 2.3 — ABNORMAL HIGH (ref 1.2–2.2)
Albumin: 4.5 g/dL (ref 3.9–5.0)
Alkaline Phosphatase: 82 IU/L (ref 44–121)
BUN/Creatinine Ratio: 14 (ref 9–23)
BUN: 9 mg/dL (ref 6–20)
Bilirubin Total: 0.4 mg/dL (ref 0.0–1.2)
CO2: 24 mmol/L (ref 20–29)
Calcium: 9.4 mg/dL (ref 8.7–10.2)
Chloride: 103 mmol/L (ref 96–106)
Creatinine, Ser: 0.66 mg/dL (ref 0.57–1.00)
Globulin, Total: 2 g/dL (ref 1.5–4.5)
Glucose: 86 mg/dL (ref 65–99)
Potassium: 4.2 mmol/L (ref 3.5–5.2)
Sodium: 140 mmol/L (ref 134–144)
Total Protein: 6.5 g/dL (ref 6.0–8.5)
eGFR: 124 mL/min/{1.73_m2} (ref >59)

## 2021-02-14 LAB — LIPID PANEL
Chol/HDL Ratio: 3 ratio (ref 0.0–4.4)
Cholesterol, Total: 178 mg/dL (ref 100–199)
HDL: 60 mg/dL (ref >39)
LDL Chol Calc (NIH): 109 mg/dL — ABNORMAL HIGH (ref 0–99)
Triglycerides: 47 mg/dL (ref 0–149)
VLDL Cholesterol Cal: 9 mg/dL (ref 5–40)

## 2021-02-14 LAB — TSH: TSH: 1.51 u[IU]/mL (ref 0.450–4.500)

## 2021-02-20 ENCOUNTER — Telehealth: Payer: Self-pay

## 2021-02-20 NOTE — Telephone Encounter (Signed)
Copied from CRM [id number]. Topic: General - Other >> Feb 20, 2021  4:08 PM Pawlus, Maxine Glenn A wrote: Reason for CRM: Pt wanted a call or comment back regarding her lab results, pt viewed the results via MyChart but needed some clarification.

## 2021-02-21 ENCOUNTER — Encounter: Payer: Self-pay | Admitting: *Deleted

## 2021-02-21 NOTE — Telephone Encounter (Signed)
{  Pt returned call to [facility].  The office was at lunch.

## 2021-02-21 NOTE — Telephone Encounter (Signed)
Patient viewed results in mychart.

## 2021-02-21 NOTE — Telephone Encounter (Signed)
LMOVM for pt to return call 

## 2021-02-28 ENCOUNTER — Other Ambulatory Visit: Payer: Self-pay

## 2021-03-01 ENCOUNTER — Other Ambulatory Visit: Payer: Self-pay

## 2021-03-01 DIAGNOSIS — F419 Anxiety disorder, unspecified: Secondary | ICD-10-CM

## 2021-03-01 MED ORDER — ESCITALOPRAM OXALATE 10 MG PO TABS: 10.0000 mg | ORAL_TABLET | Freq: Every day | ORAL | 3 refills | Status: DC

## 2021-03-01 NOTE — Telephone Encounter (Signed)
[facility] faxed refill request for the following medications:  escitalopram (LEXAPRO) 10 MG tablet    Please advise.

## 2021-03-01 NOTE — Telephone Encounter (Signed)
[facility]ing [facility]ill [facility]. Please review. Thanks!

## 2021-03-13 ENCOUNTER — Encounter: Payer: Self-pay | Admitting: Gastroenterology

## 2021-03-14 ENCOUNTER — Encounter: Payer: Self-pay | Admitting: Gastroenterology

## 2021-03-14 ENCOUNTER — Ambulatory Visit: Payer: 59 | Admitting: Certified Registered"

## 2021-03-14 ENCOUNTER — Ambulatory Visit
Admission: RE | Admit: 2021-03-14 | Discharge: 2021-03-14 | Disposition: A | Discharge status: Home / Self Care | Payer: 59 | Attending: Gastroenterology | Admitting: Gastroenterology

## 2021-03-14 ENCOUNTER — Encounter
Admission: RE | Disposition: A | Discharge status: Home / Self Care | Payer: Self-pay | Source: Home / Self Care | Attending: Gastroenterology

## 2021-03-14 ENCOUNTER — Other Ambulatory Visit: Payer: Self-pay | Admitting: Gastroenterology

## 2021-03-14 ENCOUNTER — Other Ambulatory Visit: Payer: 59

## 2021-03-14 DIAGNOSIS — K862 Cyst of pancreas: Secondary | ICD-10-CM | POA: Diagnosis present

## 2021-03-14 HISTORY — PX: EUS: SHX5427

## 2021-03-14 LAB — AMYLASE, PLEURAL OR PERITONEAL FLUID: Amylase, Fluid: 214 U/L

## 2021-03-14 SURGERY — UPPER ENDOSCOPIC ULTRASOUND (EUS) LINEAR
Anesthesia: General

## 2021-03-14 MED ORDER — CIPROFLOXACIN IN D5W 400 MG/200ML IV SOLN
INTRAVENOUS | Status: AC
  Filled 2021-03-14: qty 200

## 2021-03-14 MED ORDER — PROPOFOL 10 MG/ML IV BOLUS
INTRAVENOUS | Status: AC
  Filled 2021-03-14: qty 20

## 2021-03-14 MED ORDER — PROPOFOL 10 MG/ML IV BOLUS
INTRAVENOUS | Status: DC | PRN
  Administered 2021-03-14: 40 mg via INTRAVENOUS
  Administered 2021-03-14: 60 mg via INTRAVENOUS

## 2021-03-14 MED ORDER — LIDOCAINE HCL (PF) 2 % IJ SOLN
INTRAMUSCULAR | Status: DC | PRN
  Administered 2021-03-14: 60 mg via INTRADERMAL

## 2021-03-14 MED ORDER — PROPOFOL 500 MG/50ML IV EMUL
INTRAVENOUS | Status: DC | PRN
  Administered 2021-03-14: 200 ug/kg/min via INTRAVENOUS

## 2021-03-14 MED ORDER — CIPROFLOXACIN IN D5W 400 MG/200ML IV SOLN
INTRAVENOUS | Status: DC | PRN
  Administered 2021-03-14: 400 mg via INTRAVENOUS

## 2021-03-14 MED ORDER — MIDAZOLAM HCL 2 MG/2ML IJ SOLN
INTRAMUSCULAR | Status: DC | PRN
  Administered 2021-03-14: 2 mg via INTRAVENOUS

## 2021-03-14 MED ORDER — CIPROFLOXACIN HCL 500 MG PO TABS: 500.0000 mg | ORAL_TABLET | Freq: Two times a day (BID) | ORAL | 0 refills | Status: AC

## 2021-03-14 MED ORDER — SODIUM CHLORIDE 0.9 % IV SOLN
INTRAVENOUS | Status: DC
  Administered 2021-03-14: 1000 mL via INTRAVENOUS

## 2021-03-14 MED ORDER — MIDAZOLAM HCL 2 MG/2ML IJ SOLN
INTRAMUSCULAR | Status: AC
  Filled 2021-03-14: qty 2

## 2021-03-14 NOTE — Anesthesia Postprocedure Evaluation (Signed)
Anesthesia Post Note  Patient: Belinda Bentley  Procedure(s) Performed: UPPER ENDOSCOPIC ULTRASOUND (EUS) LINEAR  Patient location during evaluation: PACU Anesthesia Type: General Level of consciousness: awake and alert Pain management: pain level controlled Vital Signs Assessment: post-procedure vital signs reviewed and stable Respiratory status: spontaneous breathing, nonlabored ventilation and respiratory function stable Cardiovascular status: blood pressure returned to baseline and stable Postop Assessment: no apparent nausea or vomiting Anesthetic complications: no   No notable events documented.   Last Vitals:  Vitals:   03/14/21 1444 03/14/21 1454  BP: 115/79 111/72  Pulse: 79 79  Resp: 14 15  Temp:    SpO2: 100% 100%    Last Pain:  Vitals:   03/14/21 1454  TempSrc:   PainSc: 0-No pain                 Foye Deer

## 2021-03-14 NOTE — Op Note (Signed)
[facility] Gastroenterology Patient Name: Belinda Bentley Procedure Date: 03/14/2021 1:24 PM MRN: [id number] Account #: [id number] Date of Birth: 09-09-1995 Admit Type: Outpatient Age: 25 Room: [facility] ENDO ROOM 3 Gender: Female Note Status: Finalized Procedure:             Upper EUS Indications:           Pancreatic cyst on CT scan, Abdominal pain in the left                         upper quadrant Providers:             Emily Filbert Referring MD:          Ferdinand Lango. Sullivan Lone, MD (Referring MD), Midge Minium MD,                         MD (Referring MD) Medicines:             Monitored Anesthesia Care Complications:         No immediate complications. Procedure:             Pre-Anesthesia Assessment:                        - Please see pre-anesthesia assessment documentation                         already completed in Epic.                        After obtaining informed consent, the endoscope was                         passed under direct vision. Throughout the procedure,                         the patient's blood pressure, pulse, and oxygen                         saturations were monitored continuously. The EUS                         Endoscope was introduced through the mouth, and                         advanced to the duodenum for ultrasound examination                         from the stomach and duodenum. Endoscope advanced to                         second part of the duodenum. The upper EUS was                         accomplished without difficulty. The patient tolerated                         the procedure well. Findings:      ENDOSCOPIC FINDING: :      The examined esophagus was endoscopically normal.      The entire examined stomach was endoscopically  normal.      The examined duodenum was endoscopically normal.      ENDOSONOGRAPHIC FINDING: :      An anechoic and septated lesion suggestive of a cyst was identified in       the pancreatic  head. It is not in obvious communication with the       pancreatic duct. The lesion measured 20 mm by 21 mm in maximal       cross-sectional diameter. There was a single compartment thinly       septated. There was no associated mass. There was internal debris within       the fluid-filled cavity. Diagnostic needle aspiration for fluid was       performed. Color Doppler imaging was utilized prior to needle puncture       to confirm a lack of significant vascular structures within the needle       path. One pass was made with the 22 gauge needle using a transduodenal       approach. A stylet was used. The amount of fluid collected was 2 mL. The       fluid was clear, yellow and watery. Sample(s) were sent for amylase       concentration, cytology and CEA.      Pancreatic parenchymal abnormalities were noted in the pancreatic body       and pancreatic tail. These consisted of hyperechoic strands. PD head       measured 1.6 mm. PD not well seen in the body and tail.      There was no sign of significant endosonographic abnormality in the       common bile duct. The maximum diameter of the duct was 3 mm.      No abnormal-appearing lymph nodes were seen during endosonographic       examination in the celiac region (level 20) and in the peripancreatic       region.      Endosonographic imaging in the visualized portion of the liver showed no       lesion. Impression:            - Normal esophagus.                        - Normal stomach.                        - Normal examined duodenum.                        EUS:                        - A cystic lesion was seen in the pancreatic head.                         Tissue was obtained from this exam, and results are                         pending. However, the endosonographic appearance is                         suggestive of a mucinous cystic neoplasm of  indeterminate biologic behavior although the                          differential diagnosis is broad for benign cysts and                         other cystic neoplasms. Fine needle aspiration for                         fluid performed.                        - Pancreatic parenchymal abnormalities consisting of                         hyperechoic strands were noted in the pancreatic body                         and pancreatic tail. Non-specific and considered                         normal pancreas per [place] Criteria for chronic                         pancreatitis.                        - There was no sign of significant pathology in the                         common bile duct. Recommendation:        - Patient has a contact number available for                         emergencies. The signs and symptoms of potential                         delayed complications were discussed with the patient.                         Return to normal activities tomorrow. Written                         discharge instructions were provided to the patient.                        - Await cytology results and await tumor markers (CEA)                         and amylase.                        - Return to referring physician.                        - Cipro (ciprofloxacin) 500 mg PO BID for 5 days.                         Patient and family instructed to collect from pharmacy  today (sent via e script).                        - The findings and recommendations were discussed with                         the patient.                        - The findings and recommendations were discussed with                         the patient's family. Procedure Code(s):     --- Professional ---                        [phone], Esophagogastroduodenoscopy, flexible,                         transoral; with transendoscopic ultrasound-guided                         intramural or transmural fine needle                         aspiration/biopsy(s), (includes endoscopic  ultrasound                         examination limited to the esophagus, stomach or                         duodenum, and adjacent structures) CPT copyright 2019 American Medical Association. All rights reserved. The codes documented in this report are preliminary and upon coder review may  be revised to meet current compliance requirements. Attending Participation:      I personally performed the entire procedure. Emily Filbert,  03/14/2021 2:29:59 PM This report has been signed electronically. Number of Addenda: 0 Note Initiated On: 03/14/2021 1:24 PM Estimated Blood Loss:  Estimated blood loss was minimal.      [facility]

## 2021-03-14 NOTE — Transfer of Care (Signed)
Immediate Anesthesia Transfer of Care Note  Patient: Miriam Kroenke  Procedure(s) Performed: UPPER ENDOSCOPIC ULTRASOUND (EUS) LINEAR  Patient Location: PACU  Anesthesia Type:General  Level of Consciousness: drowsy and patient cooperative  Airway & Oxygen Therapy: Patient Spontanous Breathing and Patient connected to nasal cannula oxygen  Post-op Assessment: Report given to RN and Post -op Vital signs reviewed and stable  Post vital signs: Reviewed and stable  Last Vitals:  Vitals Value Taken Time  BP 100/54 03/14/21 1415  Temp 36.1 C 03/14/21 1414  Pulse 85 03/14/21 1417  Resp 17 03/14/21 1417  SpO2 100 % 03/14/21 1417  Vitals shown include unvalidated device data.  Last Pain:  Vitals:   03/14/21 1414  TempSrc: Temporal  PainSc: Asleep      Patients Stated Pain Goal: 0 (03/14/21 1301)  Complications: No notable events documented.

## 2021-03-14 NOTE — Interval H&P Note (Signed)
History and Physical Interval Note:  03/14/2021 1:33 PM  No pain today Abd soft NT.  Agrees to EUS FNA. Understands risks including infection and pancreaitis.  Belinda Bentley  has presented today for surgery, with the diagnosis of pancreatic cyst.  The various methods of treatment have been discussed with the patient and family. After consideration of risks, benefits and other options for treatment, the patient has consented to  Procedure(s): UPPER ENDOSCOPIC ULTRASOUND (EUS) LINEAR (N/A) as a surgical intervention.  The patient's history has been reviewed, patient examined, no change in status, stable for surgery.  I have reviewed the patient's chart and labs.  Questions were answered to the patient's satisfaction.     Emily Filbert

## 2021-03-14 NOTE — Progress Notes (Signed)
Tumor Board Documentation  Belinda Bentley was presented by Rayann Heman, MD ([facility]) at our Tumor Board on 03/14/2021, which included representatives from medical oncology, navigation, genetics, surgical, pathology, research, radiology.  Belinda Bentley currently presents   with history of the following treatments: none.  Additionally, we reviewed previous medical and familial history, history of present illness, and recent lab results along with all available histopathologic and imaging studies. The tumor board considered available treatment options and made the following recommendations:   Appropriate for EUS. Proceed to EUS  The following procedures/referrals were also placed: No orders of the defined types were placed in this encounter.   Clinical Trial Status: N/A  Staging used: Not Applicable  National site-specific guidelines   were discussed with respect to the case. N/A  Tumor board is a meeting of clinicians from various specialty areas who evaluate and discuss patients for whom a multidisciplinary approach is being considered. Final determinations in the plan of care are those of the provider(s). The responsibility for follow up of recommendations given during tumor board is that of the provider.   Today's extended care, comprehensive team conference, Belinda Bentley was not present for the discussion and was not examined.   Multidisciplinary Tumor Board is a multidisciplinary case peer review process.  Decisions discussed in the Multidisciplinary Tumor Board reflect the opinions of the specialists present at the conference without having examined the patient.  Ultimately, treatment and diagnostic decisions rest with the primary provider(s) and the patient.

## 2021-03-14 NOTE — Anesthesia Preprocedure Evaluation (Signed)
Anesthesia Evaluation  Patient identified by MRN, date of birth, ID band Patient awake    Reviewed: Allergy & Precautions, NPO status , Patient's Chart, lab work & pertinent test results  Airway Mallampati: II       Dental no notable dental hx.    Pulmonary asthma ,    Pulmonary exam normal        Cardiovascular negative cardio ROS Normal cardiovascular exam     Neuro/Psych Anxiety negative neurological ROS     GI/Hepatic Neg liver ROS, pancreatic cyst   Endo/Other  negative endocrine ROS  Renal/GU negative Renal ROS  negative genitourinary   Musculoskeletal negative musculoskeletal ROS (+)   Abdominal Normal abdominal exam  (+)   Peds negative pediatric ROS (+)  Hematology negative hematology ROS (+)   Anesthesia Other Findings   Reproductive/Obstetrics negative OB ROS                            Anesthesia Physical Anesthesia Plan  ASA: 2  Anesthesia Plan: General   Post-op Pain Management:    Induction: Intravenous  PONV Risk Score and Plan: 3 and TIVA and Propofol infusion  Airway Management Planned: Nasal Cannula and Natural Airway  Additional Equipment:   Intra-op Plan:   Post-operative Plan:   Informed Consent: I have reviewed the patients History and Physical, chart, labs and discussed the procedure including the risks, benefits and alternatives for the proposed anesthesia with the patient or authorized representative who has indicated his/her understanding and acceptance.     Dental advisory given  Plan Discussed with: CRNA and Anesthesiologist  Anesthesia Plan Comments:         Anesthesia Quick Evaluation

## 2021-03-15 ENCOUNTER — Encounter: Payer: Self-pay | Admitting: Gastroenterology

## 2021-03-15 LAB — MISC LABCORP TEST (SEND OUT): Labcorp test code: 142331

## 2021-03-18 LAB — CYTOLOGY - NON PAP

## 2021-04-15 ENCOUNTER — Telehealth: Payer: Self-pay | Admitting: Family Medicine

## 2021-04-15 NOTE — Telephone Encounter (Signed)
Pt is calling to request orders to repeat her liver function test [phone]

## 2021-04-16 ENCOUNTER — Telehealth: Payer: Self-pay | Admitting: Gastroenterology

## 2021-04-16 NOTE — Telephone Encounter (Signed)
Pt. Calling about test results.. she wanted to know what is the next step after her procedure. Requesting a call back

## 2021-04-18 NOTE — Telephone Encounter (Signed)
MyChart message sent to pt regarding current phone message. Advised pt that per my last message, Dr Servando Snare is requesting a follow up appt to discuss and she was to call and schedule.

## 2021-04-22 ENCOUNTER — Other Ambulatory Visit: Payer: Self-pay | Admitting: *Deleted

## 2021-04-22 DIAGNOSIS — R7989 Other specified abnormal findings of blood chemistry: Secondary | ICD-10-CM

## 2021-04-22 NOTE — Telephone Encounter (Signed)
Lab ordered per last result note. Patient was advised.

## 2021-04-24 ENCOUNTER — Ambulatory Visit: Payer: 59 | Admitting: Gastroenterology

## 2021-04-24 ENCOUNTER — Other Ambulatory Visit: Payer: Self-pay

## 2021-04-24 ENCOUNTER — Encounter: Payer: Self-pay | Admitting: Gastroenterology

## 2021-04-24 DIAGNOSIS — R109 Unspecified abdominal pain: Secondary | ICD-10-CM | POA: Diagnosis not present

## 2021-04-24 NOTE — Progress Notes (Signed)
Primary Care Physician: Maple Hudson., MD  Primary Gastroenterologist:  Dr. Midge Minium  Chief Complaint  Patient presents with   Follow up EUS results    HPI: Belinda Bentley is a 25 y.o. female here who comes for follow-up after having an EUS.  The patient's EUS showed a cystic lesion and the aspiration of the cyst showed scant material insufficient to make a diagnosis.  I have discussed the case with the physician who did the endoscopic ultrasound and he felt that this lesion was most consistent with a IPMN. The patient reports some flank pain on the right and left but mostly on the left that improves a bit when she stretches.  Past Medical History:  Diagnosis Date   Allergy    Anxiety    Asthma    Eczema     Current Outpatient Medications  Medication Sig Dispense Refill   escitalopram (LEXAPRO) 10 MG tablet Take 1 tablet (10 mg total) by mouth daily. 30 tablet 3   fluticasone (FLONASE) 50 MCG/ACT nasal spray Place 2 sprays into both nostrils daily. (Patient not taking: Reported on 02/13/2021) 16 g 6   Nutritional Supplements (JUICE PLUS FIBRE PO) Take 6 capsules by mouth daily.     triamcinolone cream (KENALOG) 0.1 % Apply 1 application topically 2 (two) times daily. (Patient not taking: Reported on 02/13/2021) 15 g 1   VITAMIN D PO Take by mouth. Occasionally (Patient not taking: Reported on 02/13/2021)     No current facility-administered medications for this visit.    Allergies as of 04/24/2021 - Review Complete 04/24/2021  Allergen Reaction Noted   Amoxicillin-pot clavulanate Nausea And Vomiting 06/08/2017   Banana Itching 02/13/2021   Sulfa antibiotics Nausea And Vomiting 01/25/2018    ROS:  General: Negative for anorexia, weight loss, fever, chills, fatigue, weakness. ENT: Negative for hoarseness, difficulty swallowing , nasal congestion. CV: Negative for chest pain, angina, palpitations, dyspnea on exertion, peripheral edema.  Respiratory: Negative  for dyspnea at rest, dyspnea on exertion, cough, sputum, wheezing.  GI: See history of present illness. GU:  Negative for dysuria, hematuria, urinary incontinence, urinary frequency, nocturnal urination.  Endo: Negative for unusual weight change.    Physical Examination:   There were no vitals taken for this visit.  General: Well-nourished, well-developed in no acute distress.  Eyes: No icterus. Conjunctivae pink. Abdomen: Mild tenderness to palpation over the ribs on the left, nondistended, no hepatosplenomegaly or masses, no abdominal bruits or hernia , no rebound or guarding.   Extremities: No lower extremity edema. No clubbing or deformities. Neuro: Alert and oriented x 3.  Grossly intact. Skin: Warm and dry, no jaundice.   Psych: Alert and cooperative, normal mood and affect.  Labs:    Imaging Studies: No results found.  Assessment and Plan:   Belinda Bentley is a 25 y.o. y/o female Who comes in for follow-up of her endoscopic ultrasound.  The patient's findings were inconclusive but did not show any malignancy.  The discussion with the physician who did the procedure, Dr. Nanda Quinton, recommended the patient have follow-up with a pancreatic surgeon to monitor any changes in the size of the lesions and consider surgery if there should be a change.  The patient's abdominal discomfort which is over her ribs mostly on the left side is consistent with musculoskeletal pain and not intestinal.  The patient has been explained the plan and she will be referred to [facility] to a pancreatic surgeon to follow these pancreatic lesions.  The patient has been explained the plan and agrees with it.     Midge Minium, MD. Clement[facility]    Note: This dictation was prepared with Dragon dictation along with smaller phrase technology. Any transcriptional errors that result from this process are unintentional.

## 2021-04-26 LAB — COMPREHENSIVE METABOLIC PANEL
ALT: 61 IU/L — ABNORMAL HIGH (ref 0–32)
AST: 39 IU/L (ref 0–40)
Albumin/Globulin Ratio: 2.4 — ABNORMAL HIGH (ref 1.2–2.2)
Albumin: 4.7 g/dL (ref 3.9–5.0)
Alkaline Phosphatase: 88 IU/L (ref 44–121)
BUN/Creatinine Ratio: 21 (ref 9–23)
BUN: 14 mg/dL (ref 6–20)
Bilirubin Total: 0.2 mg/dL (ref 0.0–1.2)
CO2: 22 mmol/L (ref 20–29)
Calcium: 9.4 mg/dL (ref 8.7–10.2)
Chloride: 105 mmol/L (ref 96–106)
Creatinine, Ser: 0.66 mg/dL (ref 0.57–1.00)
Globulin, Total: 2 g/dL (ref 1.5–4.5)
Glucose: 88 mg/dL (ref 65–99)
Potassium: 4.7 mmol/L (ref 3.5–5.2)
Sodium: 141 mmol/L (ref 134–144)
Total Protein: 6.7 g/dL (ref 6.0–8.5)
eGFR: 124 mL/min/{1.73_m2} (ref >59)

## 2021-06-14 ENCOUNTER — Other Ambulatory Visit: Payer: Self-pay

## 2021-06-14 NOTE — Progress Notes (Signed)
Established patient visit   Patient: Belinda Bentley   DOB: Dec 16, 1995   25 y.o. Female  MRN: [id number] Visit Date: 06/17/2021  Today's healthcare provider: Wilhemena Durie, MD   Chief Complaint  Patient presents with   Anxiety   Follow-up   Subjective    HPI  -patient received pap in June. Record requested and OB add to care team Delightful young lady comes in today for follow-up.  She is doing much better with her anxiety but admits that she has anxiety about potential health issues. Her husband comes in with her and they both feel the Lexapro has helped her a lot. Her only new issue today is 1 of chronic tonsillar enlargement but she is concerned because food tends to get caught in the folds and crypts of the tonsils.  She wishes to see ENT.  She does not snore nor has she had any throat infection She has follow-up not yet scheduled for [facility] for pancreatic abnormality just found on imaging.  It is almost certainly benign. She and her husband are now off of birth control but not actively trying to get pregnant but not stopping anything at this time.  We discussed discussing her Lexapro with her OB when the time comes.  The Lexapro has certainly helped her a lot with her anxiety over the past year.  She is interested in increasing the dose a little bit and her husband is agreeable to this. Anxiety, Follow-up  She was last seen for anxiety 4 months ago. Changes made at last visit include; on escitalopram.   She reports good compliance with treatment. She reports good tolerance of treatment. She is not having side effects.   She feels her anxiety is mild and Unchanged since last visit.  Symptoms: No chest pain No difficulty concentrating  No dizziness Yes fatigue  No feelings of losing control No insomnia  No irritable No palpitations  No panic attacks No racing thoughts  No shortness of breath No sweating  No tremors/shakes    GAD-7 Results GAD-7 Generalized  Anxiety Disorder Screening Tool 10/04/2020 05/17/2020 01/25/2018  1. Feeling Nervous, Anxious, or on Edge _0 2. Not Being Able to Stop or Control Worrying _1 3. Worrying Too Much About Different Things _2 4. Trouble Relaxing 0 0 0  5. Being So Restless it's Hard To Sit Still 1 0 0  6. Becoming Easily Annoyed or Irritable _3 7. Feeling Afraid As If Something Awful Might Happen _4 Total GAD-7 Score _5 Difficulty At Work, Home, or Getting  Along With Others? Somewhat difficult Somewhat difficult Not difficult at all    PHQ-9 Scores PHQ9 SCORE ONLY 06/17/2021 02/13/2021 10/04/2020  PHQ-9 Total Score 1 1 0    ---------------------------------------------------------------------------------------------------     Medications: Outpatient Medications Prior to Visit  Medication Sig   fluticasone (FLONASE) 50 MCG/ACT nasal spray Place 2 sprays into both nostrils daily. (Patient taking differently: Place 2 sprays into both nostrils as needed.)   Nutritional Supplements (JUICE PLUS FIBRE PO) Take 6 capsules by mouth daily.   triamcinolone cream (KENALOG) 0.1 % Apply 1 application topically 2 (two) times daily. (Patient taking differently: Apply 1 application topically as needed.)   VITAMIN D PO Take by mouth as needed. Occasionally   [DISCONTINUED] escitalopram (LEXAPRO) 10 MG tablet Take 1 tablet (10 mg total) by mouth daily.   No facility-administered  medications prior to visit.    Review of Systems  Constitutional:  Negative for appetite change, chills, fatigue and fever.  Respiratory:  Negative for chest tightness and shortness of breath.   Cardiovascular:  Negative for chest pain and palpitations.  Gastrointestinal:  Negative for abdominal pain, nausea and vomiting.  Neurological:  Negative for dizziness and weakness.   Last CBC Lab Results  Component Value Date   WBC 4.8 02/13/2021   HGB 12.6 02/13/2021   HCT 36.9 02/13/2021   MCV 85 02/13/2021   MCH 28.9  02/13/2021   RDW 12.4 02/13/2021   PLT 194 22/63/3354   Last metabolic panel Lab Results  Component Value Date   GLUCOSE 88 04/24/2021   NA 141 04/24/2021   K 4.7 04/24/2021   CL 105 04/24/2021   CO2 22 04/24/2021   BUN 14 04/24/2021   CREATININE 0.66 04/24/2021   EGFR 124 04/24/2021   CALCIUM 9.4 04/24/2021   PROT 6.7 04/24/2021   ALBUMIN 4.7 04/24/2021   LABGLOB 2.0 04/24/2021   AGRATIO 2.4 (H) 04/24/2021   BILITOT 0.2 04/24/2021   ALKPHOS 88 04/24/2021   AST 39 04/24/2021   ALT 61 (H) 04/24/2021   Last lipids Lab Results  Component Value Date   CHOL 178 02/13/2021   HDL 60 02/13/2021   LDLCALC 109 (H) 02/13/2021   TRIG 47 02/13/2021   CHOLHDL 3.0 02/13/2021   Last hemoglobin A1c Lab Results  Component Value Date   HGBA1C 5.3 02/22/2020   Last thyroid functions Lab Results  Component Value Date   TSH 1.510 02/13/2021   Last vitamin D No results found for: 25OHVITD2, 25OHVITD3, VD25OH Last vitamin B12 and Folate Lab Results  Component Value Date   VITAMINB12 472 04/24/2020   FOLATE >20.0 04/24/2020       Objective    BP 105/71 (BP Location: Left Arm, Patient Position: Sitting, Cuff Size: Normal)   Pulse 70   Temp 98.9 F (37.2 C) (Oral)   Ht _0  (1.549 m)   Wt 133 lb 8 oz (60.6 kg)   SpO2 100%   BMI 25.22 kg/m  BP Readings from Last 3 Encounters:  06/17/21 105/71  03/14/21 111/72  02/13/21 110/68   Wt Readings from Last 3 Encounters:  06/17/21 133 lb 8 oz (60.6 kg)  03/14/21 130 lb (59 kg)  02/13/21 131 lb 12.8 oz (59.8 kg)      Physical Exam Vitals reviewed.  Constitutional:      Appearance: Normal appearance.  HENT:     Head: Normocephalic and atraumatic.     Right Ear: External ear normal.     Left Ear: External ear normal.     Nose: Nose normal.     Mouth/Throat:     Pharynx: Oropharynx is clear.  Eyes:     General: No scleral icterus.    Conjunctiva/sclera: Conjunctivae normal.  Cardiovascular:     Rate and Rhythm:  Normal rate and regular rhythm.     Pulses: Normal pulses.     Heart sounds: Normal heart sounds.  Pulmonary:     Effort: Pulmonary effort is normal.     Breath sounds: Normal breath sounds.  Abdominal:     Palpations: Abdomen is soft.  Lymphadenopathy:     Cervical: No cervical adenopathy.  Skin:    General: Skin is warm and dry.     Findings: No rash.  Neurological:     General: No focal deficit present.     Mental Status: She  is alert and oriented to person, place, and time.     Cranial Nerves: No cranial nerve deficit.     Sensory: No sensory deficit.     Motor: No weakness.     Coordination: Coordination normal.     Gait: Gait normal.     Deep Tendon Reflexes: Reflexes normal.  Psychiatric:        Mood and Affect: Mood normal.        Behavior: Behavior normal.        Thought Content: Thought content normal.        Judgment: Judgment normal.      No results found for any visits on 06/17/21.  Assessment & Plan     1. Anxiety Crease Lexapro from 10 mg to 15 mg daily and I will see her back in 3 to 4 months. She is much better since starting the Lexapro. - escitalopram (LEXAPRO) 10 MG tablet; Take 1 and 1/2 Tablets Daily.  Dispense: 45 tablet; Refill: 3  2. Enlarged tonsils Refer to ENT at her request. - Ambulatory referral to ENT  3. Pancreatic cyst Has follow-up scheduled at [facility]  4. Seasonal allergies Clinically stable   Return in about 4 months (around 10/15/2021).      I, Wilhemena Durie, MD, have reviewed all documentation for this visit. The documentation on 06/17/21 for the exam, diagnosis, procedures, and orders are all accurate and complete.    Adlynn Lowenstein Cranford Mon, MD  [facility] [phone] (phone) [phone] (fax)  [place]

## 2021-06-17 ENCOUNTER — Ambulatory Visit: Payer: 59 | Admitting: Family Medicine

## 2021-06-17 ENCOUNTER — Other Ambulatory Visit: Payer: Self-pay

## 2021-06-17 ENCOUNTER — Encounter: Payer: Self-pay | Admitting: Family Medicine

## 2021-06-17 VITALS — BP 105/71 | HR 70 | Temp 98.9°F | Ht 61.0 in | Wt 133.5 lb

## 2021-06-17 DIAGNOSIS — F419 Anxiety disorder, unspecified: Secondary | ICD-10-CM | POA: Diagnosis not present

## 2021-06-17 DIAGNOSIS — K862 Cyst of pancreas: Secondary | ICD-10-CM

## 2021-06-17 DIAGNOSIS — J302 Other seasonal allergic rhinitis: Secondary | ICD-10-CM

## 2021-06-17 DIAGNOSIS — J351 Hypertrophy of tonsils: Secondary | ICD-10-CM | POA: Diagnosis not present

## 2021-06-17 MED ORDER — ESCITALOPRAM OXALATE 10 MG PO TABS: ORAL_TABLET | ORAL | 3 refills | Status: AC

## 2021-07-18 ENCOUNTER — Other Ambulatory Visit: Payer: Self-pay

## 2021-08-24 IMAGING — CR DG CHEST 2V
1 series · 2 of 2 positions shown · non-contrast
Comparison: Chest radiograph 01/25/2018

CLINICAL DATA: Pt states left forearm discomfort and LUQ abdominal
pain since [REDACTED]. History of mild asthma.

EXAM:
CHEST - 2 VIEW

[Series 1: dg chest 2 view · 0.14mm/px · 2 of 2 slices shown]
[im 1/2]
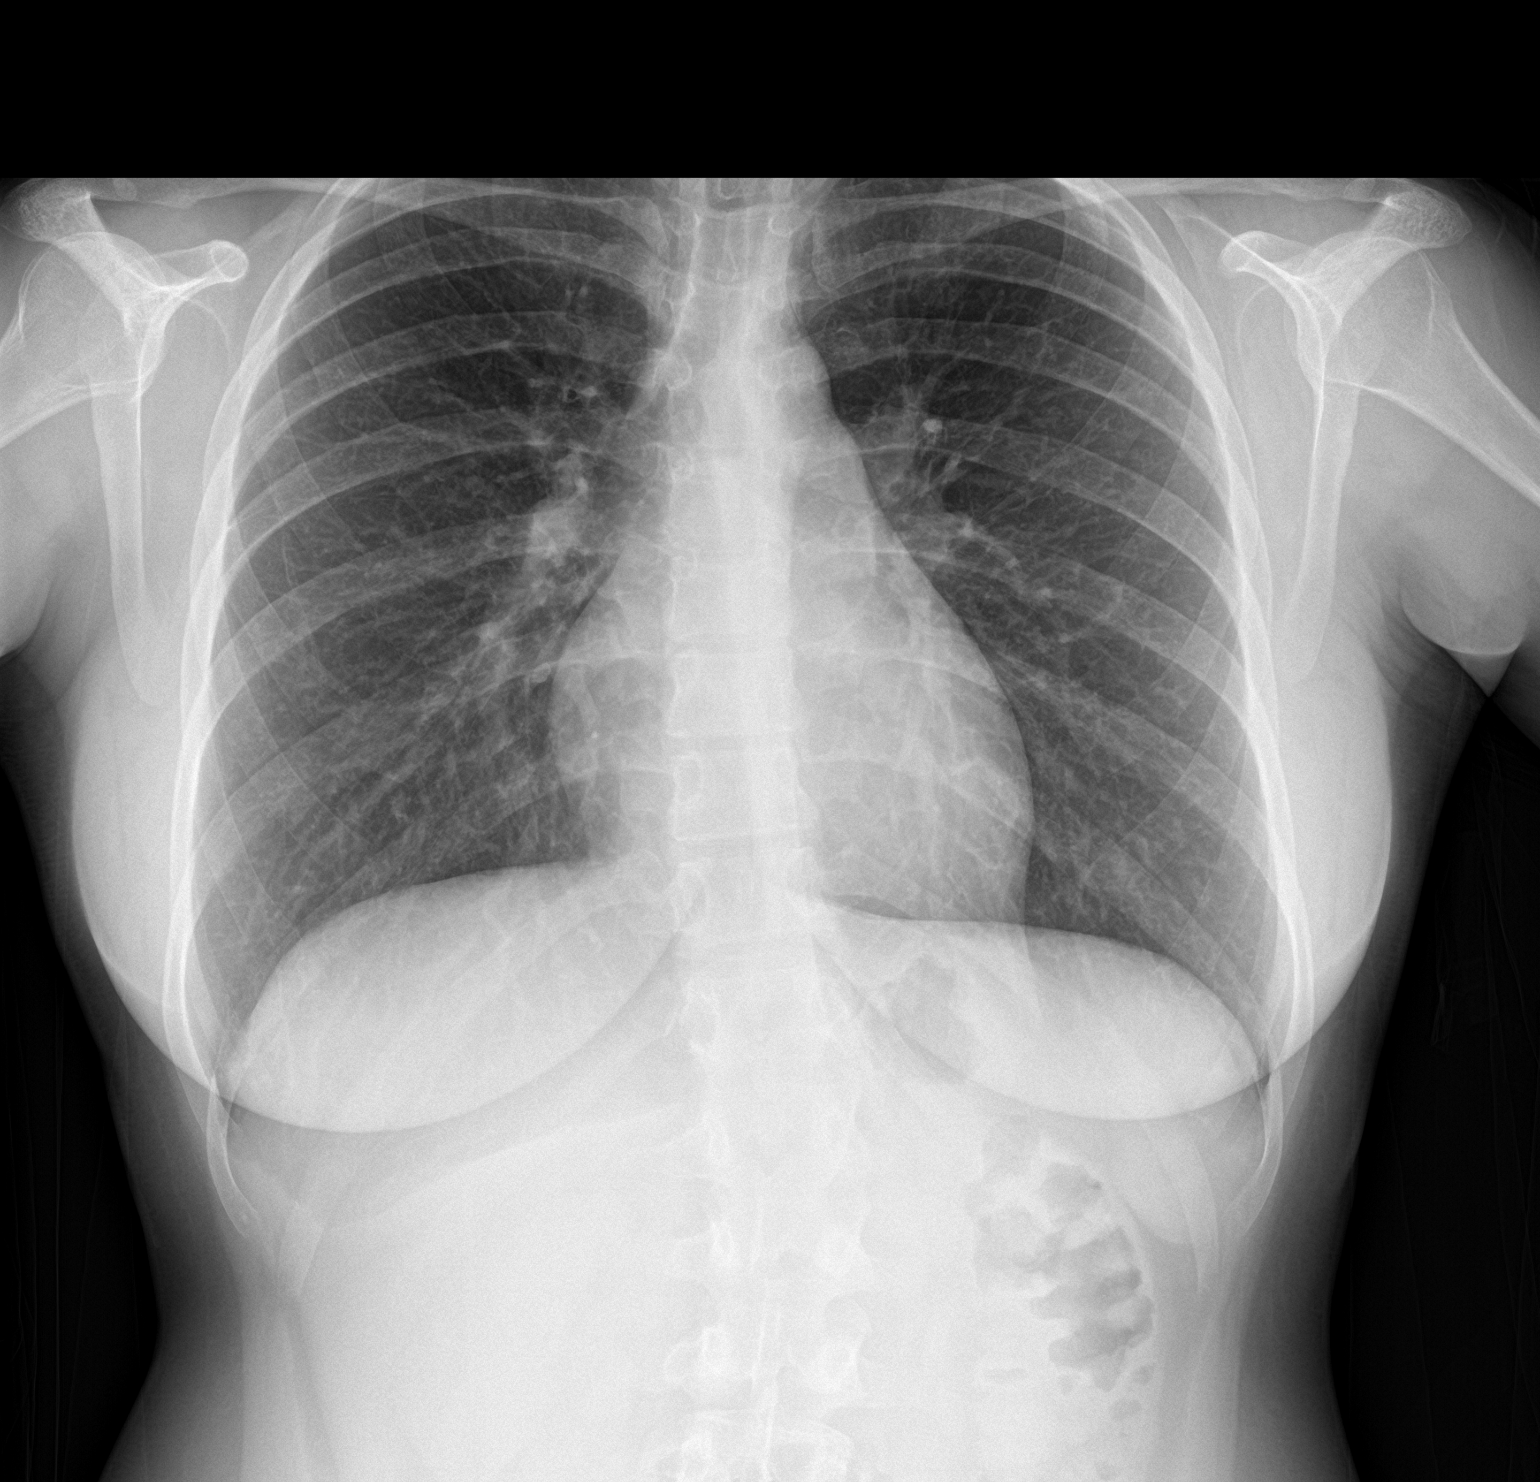
[im 2/2]
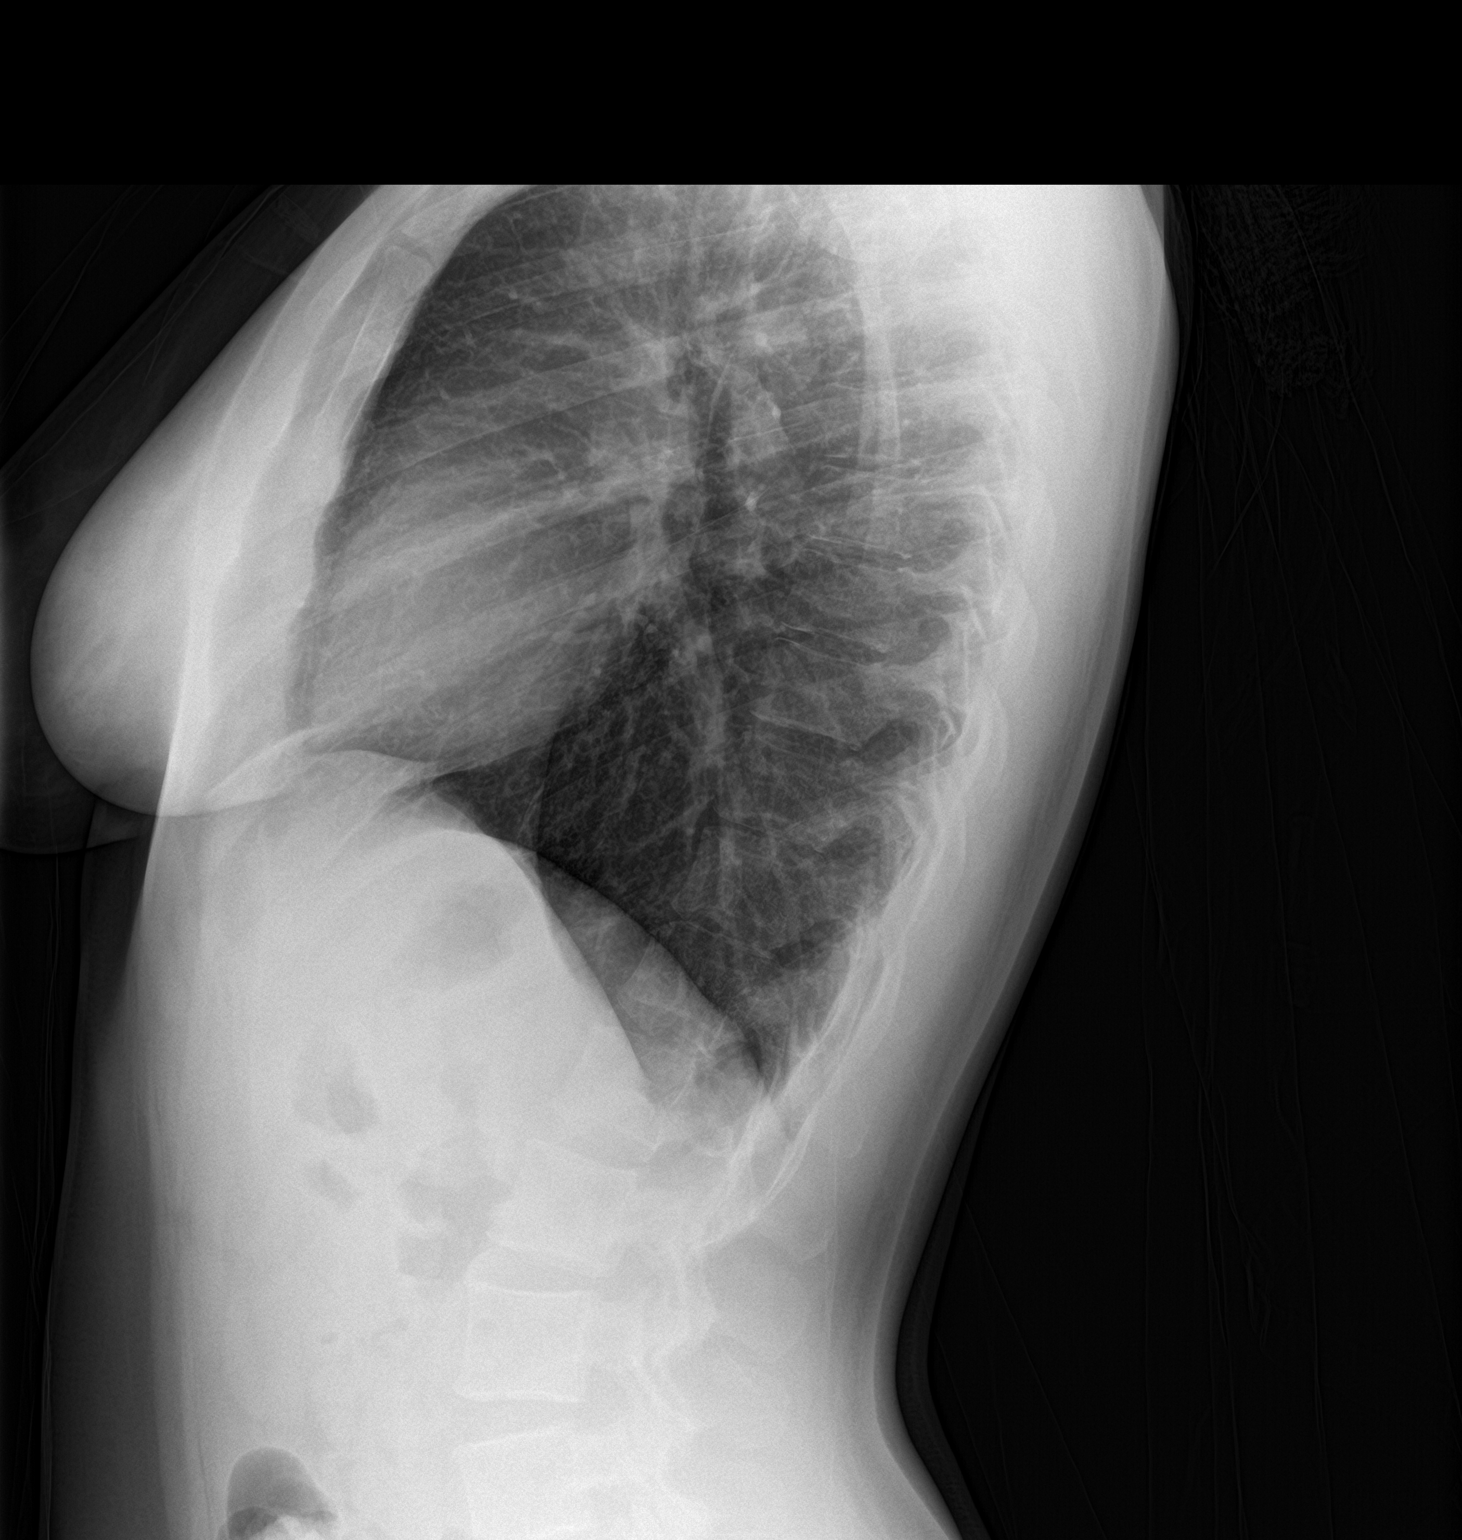

[2 of 2 positions shown; findings below may reference images not displayed]

FINDINGS: The cardiomediastinal contours are within normal limits. The lungs
are clear. No pneumothorax or pleural effusion. Mild scoliotic
curvature of the spine.
IMPRESSION: No active cardiopulmonary disease.

## 2021-08-29 LAB — OB RESULTS CONSOLE HEPATITIS B SURFACE ANTIGEN: Hepatitis B Surface Ag: NEGATIVE

## 2021-08-29 LAB — OB RESULTS CONSOLE HIV ANTIBODY (ROUTINE TESTING): HIV: NONREACTIVE

## 2021-09-08 LAB — OB RESULTS CONSOLE GC/CHLAMYDIA
Chlamydia: NEGATIVE
Neisseria Gonorrhea: NEGATIVE

## 2021-09-08 LAB — OB RESULTS CONSOLE RUBELLA ANTIBODY, IGM: Rubella: NON-IMMUNE/NOT IMMUNE

## 2021-10-14 NOTE — Progress Notes (Unsigned)
°  ° ° °  Established patient visit   Patient: Belinda Bentley   DOB: 10-17-1995   26 y.o. Female  MRN: [id number] Visit Date: 10/15/2021  Today's healthcare provider: [name], MD   No chief complaint on file.  Subjective    HPI  ***  Medications: Outpatient Medications Prior to Visit  Medication Sig   escitalopram (LEXAPRO) 10 MG tablet Take 1 and 1/2 Tablets Daily.   fluticasone (FLONASE) 50 MCG/ACT nasal spray Place 2 sprays into both nostrils daily. (Patient taking differently: Place 2 sprays into both nostrils as needed.)   Nutritional Supplements (JUICE PLUS FIBRE PO) Take 6 capsules by mouth daily.   triamcinolone cream (KENALOG) 0.1 % Apply 1 application topically 2 (two) times daily. (Patient taking differently: Apply 1 application topically as needed.)   VITAMIN D PO Take by mouth as needed. Occasionally   No facility-administered medications prior to visit.    Review of Systems  Constitutional:  Negative for appetite change, chills, fatigue and fever.  Respiratory:  Negative for chest tightness and shortness of breath.   Cardiovascular:  Negative for chest pain and palpitations.  Gastrointestinal:  Negative for abdominal pain, nausea and vomiting.  Neurological:  Negative for dizziness and weakness.   {Labs   Heme   Chem   Endocrine   Serology   Results Review (optional):23779}   Objective    There were no vitals taken for this visit. {Show previous vital signs (optional):23777}  Physical Exam  ***  No results found for any visits on 10/15/21.  Assessment & Plan     ***  No follow-ups on file.      {provider attestation***:1}   [name], MD  [facility] [phone] (phone) [phone] (fax)  [facility]

## 2021-10-15 ENCOUNTER — Encounter: Payer: Self-pay | Admitting: Family Medicine

## 2021-10-15 ENCOUNTER — Ambulatory Visit: Payer: 59 | Admitting: Family Medicine

## 2021-10-15 ENCOUNTER — Other Ambulatory Visit: Payer: Self-pay

## 2021-10-15 VITALS — BP 95/62 | HR 84 | Temp 98.6°F | Resp 16 | Ht 61.0 in | Wt 131.0 lb

## 2021-10-15 DIAGNOSIS — Z3A15 15 weeks gestation of pregnancy: Secondary | ICD-10-CM | POA: Diagnosis not present

## 2021-10-15 DIAGNOSIS — F419 Anxiety disorder, unspecified: Secondary | ICD-10-CM | POA: Diagnosis not present

## 2021-10-15 DIAGNOSIS — R7989 Other specified abnormal findings of blood chemistry: Secondary | ICD-10-CM | POA: Diagnosis not present

## 2021-10-16 LAB — HEPATIC FUNCTION PANEL
ALT: 24 IU/L (ref 0–32)
AST: 25 IU/L (ref 0–40)
Albumin: 4.1 g/dL (ref 3.9–5.0)
Alkaline Phosphatase: 57 IU/L (ref 44–121)
Bilirubin Total: 0.3 mg/dL (ref 0.0–1.2)
Bilirubin, Direct: <0.1 mg/dL (ref 0.00–0.40)
Total Protein: 6.4 g/dL (ref 6.0–8.5)

## 2021-12-24 LAB — OB RESULTS CONSOLE RUBELLA ANTIBODY, IGM
Rubella: IMMUNE
Rubella: NON-IMMUNE/NOT IMMUNE

## 2022-02-28 ENCOUNTER — Inpatient Hospital Stay (HOSPITAL_COMMUNITY)
Admission: AD | Admit: 2022-02-28 | Discharge: 2022-03-03 | DRG: 807 | Disposition: A | Discharge status: Home / Self Care | Payer: 59 | Attending: Obstetrics and Gynecology | Admitting: Obstetrics and Gynecology

## 2022-02-28 ENCOUNTER — Other Ambulatory Visit: Payer: Self-pay

## 2022-02-28 ENCOUNTER — Encounter (HOSPITAL_COMMUNITY): Payer: Self-pay | Admitting: *Deleted

## 2022-02-28 DIAGNOSIS — O42919 Preterm premature rupture of membranes, unspecified as to length of time between rupture and onset of labor, unspecified trimester: Principal | ICD-10-CM

## 2022-02-28 DIAGNOSIS — O99344 Other mental disorders complicating childbirth: Secondary | ICD-10-CM | POA: Diagnosis present

## 2022-02-28 DIAGNOSIS — Z23 Encounter for immunization: Secondary | ICD-10-CM

## 2022-02-28 DIAGNOSIS — F419 Anxiety disorder, unspecified: Secondary | ICD-10-CM | POA: Diagnosis present

## 2022-02-28 DIAGNOSIS — Z3A36 36 weeks gestation of pregnancy: Secondary | ICD-10-CM

## 2022-02-28 DIAGNOSIS — Z8759 Personal history of other complications of pregnancy, childbirth and the puerperium: Secondary | ICD-10-CM

## 2022-02-28 DIAGNOSIS — O42913 Preterm premature rupture of membranes, unspecified as to length of time between rupture and onset of labor, third trimester: Principal | ICD-10-CM | POA: Diagnosis present

## 2022-02-28 LAB — CBC
HCT: 35.1 % — ABNORMAL LOW (ref 36.0–46.0)
Hemoglobin: 11.7 g/dL — ABNORMAL LOW (ref 12.0–15.0)
MCH: 30 pg (ref 26.0–34.0)
MCHC: 33.3 g/dL (ref 30.0–36.0)
MCV: 90 fL (ref 80.0–100.0)
Platelets: 132 10*3/uL — ABNORMAL LOW (ref 150–400)
RBC: 3.9 MIL/uL (ref 3.87–5.11)
RDW: 12.9 % (ref 11.5–15.5)
WBC: 8 10*3/uL (ref 4.0–10.5)
nRBC: 0 % (ref 0.0–0.2)

## 2022-02-28 LAB — TYPE AND SCREEN
ABO/RH(D): B POS
Antibody Screen: NEGATIVE

## 2022-02-28 LAB — GROUP B STREP BY PCR: Group B strep by PCR: POSITIVE — AB

## 2022-02-28 LAB — AMNISURE RUPTURE OF MEMBRANE (ROM) NOT AT ARMC: Amnisure ROM: POSITIVE

## 2022-02-28 LAB — POCT FERN TEST: POCT Fern Test: POSITIVE

## 2022-02-28 MED ORDER — EPHEDRINE 5 MG/ML INJ
10.0000 mg | INTRAVENOUS | Status: DC | PRN
  Filled 2022-02-28: qty 5

## 2022-02-28 MED ORDER — FENTANYL CITRATE (PF) 100 MCG/2ML IJ SOLN
50.0000 ug | INTRAMUSCULAR | Status: DC | PRN
  Administered 2022-02-28: 100 ug via INTRAVENOUS
  Filled 2022-02-28: qty 2

## 2022-02-28 MED ORDER — LACTATED RINGERS IV SOLN
INTRAVENOUS | Status: DC
Start: 2022-02-28 — End: 2022-03-01

## 2022-02-28 MED ORDER — TERBUTALINE SULFATE 1 MG/ML IJ SOLN: 0.2500 mg | Freq: Once | INTRAMUSCULAR | Status: DC | PRN

## 2022-02-28 MED ORDER — MISOPROSTOL 50MCG HALF TABLET
50.0000 ug | ORAL_TABLET | ORAL | Status: DC
  Administered 2022-02-28: 50 ug via ORAL
  Filled 2022-02-28: qty 1

## 2022-02-28 MED ORDER — PENICILLIN G POT IN DEXTROSE 60000 UNIT/ML IV SOLN
3.0000 10*6.[IU] | INTRAVENOUS | Status: DC
  Administered 2022-02-28 – 2022-03-01 (×3): 3 10*6.[IU] via INTRAVENOUS
  Filled 2022-02-28 (×6): qty 50

## 2022-02-28 MED ORDER — DIPHENHYDRAMINE HCL 50 MG/ML IJ SOLN: 12.5000 mg | INTRAMUSCULAR | Status: DC | PRN

## 2022-02-28 MED ORDER — OXYTOCIN BOLUS FROM INFUSION
333.0000 mL | Freq: Once | INTRAVENOUS | Status: AC
  Administered 2022-03-01: 333 mL via INTRAVENOUS

## 2022-02-28 MED ORDER — OXYCODONE-ACETAMINOPHEN 5-325 MG PO TABS: 1.0000 | ORAL_TABLET | ORAL | Status: DC | PRN

## 2022-02-28 MED ORDER — LACTATED RINGERS IV SOLN
500.0000 mL | INTRAVENOUS | Status: DC | PRN
  Administered 2022-02-28: 500 mL via INTRAVENOUS

## 2022-02-28 MED ORDER — OXYCODONE-ACETAMINOPHEN 5-325 MG PO TABS: 2.0000 | ORAL_TABLET | ORAL | Status: DC | PRN

## 2022-02-28 MED ORDER — PHENYLEPHRINE 80 MCG/ML (10ML) SYRINGE FOR IV PUSH (FOR BLOOD PRESSURE SUPPORT): 80.0000 ug | PREFILLED_SYRINGE | INTRAVENOUS | Status: DC | PRN

## 2022-02-28 MED ORDER — HYDROXYZINE HCL 50 MG PO TABS: 50.0000 mg | ORAL_TABLET | Freq: Four times a day (QID) | ORAL | Status: DC | PRN

## 2022-02-28 MED ORDER — EPHEDRINE 5 MG/ML INJ: 10.0000 mg | INTRAVENOUS | Status: DC | PRN

## 2022-02-28 MED ORDER — LIDOCAINE HCL (PF) 1 % IJ SOLN: 30.0000 mL | INTRAMUSCULAR | Status: DC | PRN

## 2022-02-28 MED ORDER — ONDANSETRON HCL 4 MG/2ML IJ SOLN
4.0000 mg | Freq: Four times a day (QID) | INTRAMUSCULAR | Status: DC | PRN
  Administered 2022-02-28 – 2022-03-01 (×2): 4 mg via INTRAVENOUS
  Filled 2022-02-28 (×2): qty 2

## 2022-02-28 MED ORDER — FLEET ENEMA 7-19 GM/118ML RE ENEM: 1.0000 | ENEMA | RECTAL | Status: DC | PRN

## 2022-02-28 MED ORDER — FENTANYL-BUPIVACAINE-NACL 0.5-0.125-0.9 MG/250ML-% EP SOLN
12.0000 mL/h | EPIDURAL | Status: DC | PRN
  Administered 2022-03-01: 11 mL/h via EPIDURAL
  Filled 2022-02-28: qty 250

## 2022-02-28 MED ORDER — SODIUM CHLORIDE 0.9 % IV SOLN
5.0000 10*6.[IU] | Freq: Once | INTRAVENOUS | Status: AC
  Administered 2022-02-28: 5 10*6.[IU] via INTRAVENOUS
  Filled 2022-02-28: qty 5

## 2022-02-28 MED ORDER — OXYTOCIN-SODIUM CHLORIDE 30-0.9 UT/500ML-% IV SOLN
2.5000 [IU]/h | INTRAVENOUS | Status: DC
  Administered 2022-03-01: 2.5 [IU]/h via INTRAVENOUS
  Filled 2022-02-28: qty 500

## 2022-02-28 MED ORDER — ACETAMINOPHEN 325 MG PO TABS: 650.0000 mg | ORAL_TABLET | ORAL | Status: DC | PRN

## 2022-02-28 MED ORDER — SOD CITRATE-CITRIC ACID 500-334 MG/5ML PO SOLN: 30.0000 mL | ORAL | Status: DC | PRN

## 2022-02-28 MED ORDER — LACTATED RINGERS IV SOLN
500.0000 mL | Freq: Once | INTRAVENOUS | Status: AC
  Administered 2022-03-01: 500 mL via INTRAVENOUS

## 2022-02-28 MED ORDER — OXYTOCIN-SODIUM CHLORIDE 30-0.9 UT/500ML-% IV SOLN: 1.0000 m[IU]/min | INTRAVENOUS | Status: DC

## 2022-02-28 NOTE — MAU Provider Note (Cosign Needed Addendum)
Chief Complaint:  Rupture of Membranes   Event Date/Time   First Provider Initiated Contact with Patient 02/28/22 1320      HPI: Belinda Bentley is a 26 y.o. G1P0 at [redacted]w[redacted]d by LMP who presents to maternity admissions sent from the office for further evaluation of possible ROM.  Pt reported an increase in watery discharge the last few days, wearing but not soaking pantyliners, not enough to require a pad. SSE in office with negative pooling and ferning but positive nitrazine test.  She denies any cramping/contractions. She reports good fetal movement.   HPI  Past Medical History: Past Medical History:  Diagnosis Date   Allergy    Anxiety    Asthma    Eczema     Past obstetric history: OB History  Gravida Para Term Preterm AB Living  1            SAB IAB Ectopic Multiple Live Births               # Outcome Date GA Lbr Len/2nd Weight Sex Delivery Anes PTL Lv  1 Current             Past Surgical History: Past Surgical History:  Procedure Laterality Date   EUS N/A 03/14/2021   Procedure: UPPER ENDOSCOPIC ULTRASOUND (EUS) LINEAR;  Surgeon: Rayann Heman, MD;  Location: [facility] ENDOSCOPY;  Service: Endoscopy;  Laterality: N/A;   MOUTH SURGERY  2018   ranula removal   WISDOM TOOTH EXTRACTION  2014    Family History: Family History  Problem Relation Age of Onset   Anxiety disorder Mother    Asthma Father    Allergies Father    Clotting disorder Maternal Grandfather    Cancer Paternal Grandmother        breast   Allergies Brother    Multiple sclerosis Neg Hx     Social History: Social History   Tobacco Use   Smoking status: Never   Smokeless tobacco: Never  Vaping Use   Vaping Use: Never used  Substance Use Topics   Alcohol use: Not Currently   Drug use: Never    Allergies:  Allergies  Allergen Reactions   Amoxicillin-Pot Clavulanate Nausea And Vomiting    Other reaction(s): Vomiting   Banana Itching   Sulfa Antibiotics Nausea And Vomiting    Other  reaction(s): Vomiting    Meds:  Medications Prior to Admission  Medication Sig Dispense Refill Last Dose   escitalopram (LEXAPRO) 10 MG tablet Take 1 and 1/2 Tablets Daily. (Patient taking differently: 5 mg.) 45 tablet 3 Past Week   Nutritional Supplements (JUICE PLUS FIBRE PO) Take 6 capsules by mouth daily.   02/28/2022   Prenatal Vit-Fe Fumarate-FA (PRENATAL PO) Take by mouth daily.   02/28/2022   fluticasone (FLONASE) 50 MCG/ACT nasal spray Place 2 sprays into both nostrils daily. (Patient not taking: Reported on 10/15/2021) 16 g 6    triamcinolone cream (KENALOG) 0.1 % Apply 1 application topically 2 (two) times daily. (Patient not taking: Reported on 10/15/2021) 15 g 1    VITAMIN D PO Take by mouth as needed. Occasionally (Patient not taking: Reported on 10/15/2021)       ROS:  Review of Systems  Constitutional:  Negative for chills, fatigue and fever.  Eyes:  Negative for visual disturbance.  Respiratory:  Negative for shortness of breath.   Cardiovascular:  Negative for chest pain.  Gastrointestinal:  Negative for abdominal pain, nausea and vomiting.  Genitourinary:  Positive for vaginal discharge. Negative  for difficulty urinating, dysuria, flank pain, pelvic pain, vaginal bleeding and vaginal pain.  Neurological:  Negative for dizziness and headaches.  Psychiatric/Behavioral: Negative.       I have reviewed patient's Past Medical Hx, Surgical Hx, Family Hx, Social Hx, medications and allergies.   Physical Exam  Patient Vitals for the past 24 hrs:  BP Temp Pulse Resp SpO2 Height Weight  02/28/22 1234 123/76 -- 78 -- 100 % -- --  02/28/22 1218 113/75 98.7 F (37.1 C) 80 18 -- 5' 1.5" (1.562 m) 66.7 kg   Constitutional: Well-developed, well-nourished female in no acute distress.  Cardiovascular: normal rate Respiratory: normal effort GI: Abd soft, non-tender, gravid appropriate for gestational age.  MS: Extremities nontender, no edema, normal ROM Neurologic: Alert and  oriented x 4.  GU: Neg CVAT.  PELVIC EXAM: SSE with negative pooling but scant shiny clear fluid at cervical os     FHT:  Baseline 135 , moderate variability, accelerations present, no decelerations Contractions: none on toco or to palpation   Labs: Results for orders placed or performed during the hospital encounter of 02/28/22 (from the past 24 hour(s))  Amnisure rupture of membrane (rom)not at [facility]     Status: None   Collection Time: 02/28/22 12:36 PM  Result Value Ref Range   Amnisure ROM POSITIVE   Fern Test     Status: None   Collection Time: 02/28/22  1:37 PM  Result Value Ref Range   POCT Fern Test Positive = ruptured amniotic membanes       Imaging:  No results found.  MAU Course/MDM: Orders Placed This Encounter  Procedures   Amnisure rupture of membrane (rom)not at [facility]   Contraction - monitoring   External fetal heart monitoring   Fern Test    No orders of the defined types were placed in this encounter.    NST reviewed and reactive No evidence of labor with closed cervix and no ctx on toco or per pt Amnisure collected by RN by blind swab is positive NICU full so attending will need to call to determine admission vs transfer to another facility if pt ruptured, discussed with pt and plan to repeat SSE for more information Pooling negative but ferning positive on exam, so given positive amnisure and ferning, clear evidence of PPROM Called Dr Henderson Cloud for admission Dr Henderson Cloud to call NICU     Assessment: 1. Preterm premature rupture of membranes (PPROM) with unknown onset of labor   2. [redacted] weeks gestation of pregnancy     Plan: Admission vs transfer to accepting facility pending NICU consult    Sharen Counter Certified Nurse-Midwife 02/28/2022 1:49 PM

## 2022-02-28 NOTE — H&P (Signed)
Belinda Bentley is a 26 y.o. female presenting for leaking fluid. She has noticed increased clear, watery discharge for last several days, no gush, no bleeding, no hard UCs, no fever. Today in office NTZ positive and cx closed/20%/-2. In MAU fern positive and Amniosure +. Pregnancy complicated by Hx of anxiety on Lexapro. She has been taking Lexapro qod for a while and has taken none in past several days. Asthma no meds. Hx of pancreatic cyst. OB History     Gravida  1   Para      Term      Preterm      AB      Living         SAB      IAB      Ectopic      Multiple      Live Births             Past Medical History:  Diagnosis Date   Allergy    Anxiety    Asthma    Eczema    Past Surgical History:  Procedure Laterality Date   EUS N/A 03/14/2021   Procedure: UPPER ENDOSCOPIC ULTRASOUND (EUS) LINEAR;  Surgeon: Rayann Heman, MD;  Location: [facility] ENDOSCOPY;  Service: Endoscopy;  Laterality: N/A;   MOUTH SURGERY  2018   ranula removal   WISDOM TOOTH EXTRACTION  2014   Family History: family history includes Allergies in her brother and father; Anxiety disorder in her mother; Asthma in her father; Cancer in her paternal grandmother; Clotting disorder in her maternal grandfather. Social History:  reports that she has never smoked. She has never used smokeless tobacco. She reports that she does not currently use alcohol. She reports that she does not use drugs.     Maternal Diabetes: No Genetic Screening: Normal Maternal Ultrasounds/Referrals: Normal Fetal Ultrasounds or other Referrals:  None Maternal Substance Abuse:  No Significant Maternal Medications:  Meds include: Other: Lexapro Significant Maternal Lab Results:  None Number of Prenatal Visits:greater than 3 verified prenatal visits Other Comments:  None  Review of Systems  Constitutional:  Negative for fever.  Eyes:  Negative for visual disturbance.  Gastrointestinal:  Negative for abdominal pain.   Neurological:  Negative for headaches.   Maternal Medical History:  Fetal activity: Perceived fetal activity is normal.       Blood pressure 123/76, pulse 78, temperature 98.7 F (37.1 C), resp. rate 18, height 5' 1.5" (1.562 m), weight 66.7 kg, last menstrual period 03/07/2021, SpO2 100 %. Maternal Exam:  Abdomen: Fetal presentation: vertex   Fetal Exam Fetal State Assessment: Category I - tracings are normal.   Physical Exam Cardiovascular:     Rate and Rhythm: Normal rate.  Pulmonary:     Effort: Pulmonary effort is normal.    BSUS>VTX  Prenatal labs: ABO, Rh:   Antibody:   Rubella:   RPR:    HBsAg:    HIV:    GBS:   unknown  Assessment/Plan: 26 yo G1P0 @ 36 2/7 weeks  PPROM for undetermined length of time, possibly a week  D/W neonatologist who states they have room if baby needs admission D/W patient and husband recommendation for delivery. D/W two stage induction with success/failure rates. D/W premature status of baby possibly requiring NICU care.  GBBS PCR ordered Will start ATB for GBBS prophylaxis Misoprostol PO ordered  Belinda Bentley II 02/28/2022, 2:31 PM

## 2022-02-28 NOTE — MAU Note (Signed)
Contacted [facility] regarding patient's allergy to Amoxicillin and Sulfa abx with the administration of PCN G. Pharmacist states it is okay to proceed with administration.

## 2022-02-28 NOTE — MAU Note (Signed)
Amnisure positive and Fern positive.   Patient reports "I would say last week on Friday or Saturday is when I noticed my discharge was more watery when I wiped." She then reported, "On Sunday while we were taking pictures I began to notice I was having more of the watery discharge." She reports she noticed throughout the week that her underwear were increasingly wet. She reports there were also times throughout the week where she would not experience any of the "discharge" for hours.   Unable to document accurate timing for ROM.

## 2022-02-28 NOTE — MAU Note (Addendum)
.  Belinda Bentley is a 26 y.o. at Unknown here in MAU reporting: she has has small amout of watery discharge since last week. Went to ob appointment and Nitrazine paper ws positive but no ferning on slide. Sent from office for further eval. Denies any pain or cramping. Good fetal movement . Closed on SVE today.  LMP:  Onset of complaint:  1 week Pain score: 0  Vitals:   02/28/22 1218  BP: 113/75  Pulse: 80  Resp: 18  Temp: 98.7 F (37.1 C)     FHT:135 Lab orders placed from triage:  fern

## 2022-03-01 ENCOUNTER — Inpatient Hospital Stay (HOSPITAL_COMMUNITY): Payer: 59 | Admitting: Anesthesiology

## 2022-03-01 ENCOUNTER — Encounter (HOSPITAL_COMMUNITY): Payer: Self-pay | Admitting: Obstetrics and Gynecology

## 2022-03-01 LAB — CBC WITH DIFFERENTIAL/PLATELET
Abs Immature Granulocytes: 0.04 10*3/uL (ref 0.00–0.07)
Basophils Absolute: 0 10*3/uL (ref 0.0–0.1)
Basophils Relative: 0 %
Eosinophils Absolute: 0 10*3/uL (ref 0.0–0.5)
Eosinophils Relative: 0 %
HCT: 31.6 % — ABNORMAL LOW (ref 36.0–46.0)
Hemoglobin: 11.1 g/dL — ABNORMAL LOW (ref 12.0–15.0)
Immature Granulocytes: 0 %
Lymphocytes Relative: 10 %
Lymphs Abs: 1.2 10*3/uL (ref 0.7–4.0)
MCH: 30.7 pg (ref 26.0–34.0)
MCHC: 35.1 g/dL (ref 30.0–36.0)
MCV: 87.5 fL (ref 80.0–100.0)
Monocytes Absolute: 0.7 10*3/uL (ref 0.1–1.0)
Monocytes Relative: 6 %
Neutro Abs: 9.5 10*3/uL — ABNORMAL HIGH (ref 1.7–7.7)
Neutrophils Relative %: 84 %
Platelets: 126 10*3/uL — ABNORMAL LOW (ref 150–400)
RBC: 3.61 MIL/uL — ABNORMAL LOW (ref 3.87–5.11)
RDW: 12.6 % (ref 11.5–15.5)
WBC: 11.5 10*3/uL — ABNORMAL HIGH (ref 4.0–10.5)
nRBC: 0 % (ref 0.0–0.2)

## 2022-03-01 LAB — RPR: RPR Ser Ql: NONREACTIVE

## 2022-03-01 MED ORDER — ZOLPIDEM TARTRATE 5 MG PO TABS: 5.0000 mg | ORAL_TABLET | Freq: Every evening | ORAL | Status: DC | PRN

## 2022-03-01 MED ORDER — MISOPROSTOL 200 MCG PO TABS
800.0000 ug | ORAL_TABLET | Freq: Once | ORAL | Status: AC
  Administered 2022-03-01: 800 ug via RECTAL

## 2022-03-01 MED ORDER — BENZOCAINE-MENTHOL 20-0.5 % EX AERO: 1.0000 | INHALATION_SPRAY | CUTANEOUS | Status: DC | PRN

## 2022-03-01 MED ORDER — WITCH HAZEL-GLYCERIN EX PADS: 1.0000 | MEDICATED_PAD | CUTANEOUS | Status: DC | PRN

## 2022-03-01 MED ORDER — COCONUT OIL OIL: 1.0000 | TOPICAL_OIL | Status: DC | PRN

## 2022-03-01 MED ORDER — LIDOCAINE HCL (PF) 1 % IJ SOLN
INTRAMUSCULAR | Status: DC | PRN
  Administered 2022-03-01 (×2): 4 mL via EPIDURAL

## 2022-03-01 MED ORDER — OXYTOCIN-SODIUM CHLORIDE 30-0.9 UT/500ML-% IV SOLN: 1.0000 m[IU]/min | INTRAVENOUS | Status: DC

## 2022-03-01 MED ORDER — ONDANSETRON HCL 4 MG PO TABS: 4.0000 mg | ORAL_TABLET | ORAL | Status: DC | PRN

## 2022-03-01 MED ORDER — SENNOSIDES-DOCUSATE SODIUM 8.6-50 MG PO TABS
2.0000 | ORAL_TABLET | ORAL | Status: DC
Start: 2022-03-01 — End: 2022-03-03
  Administered 2022-03-01 – 2022-03-03 (×3): 2 via ORAL
  Filled 2022-03-01 (×3): qty 2

## 2022-03-01 MED ORDER — METHYLERGONOVINE MALEATE 0.2 MG/ML IJ SOLN
INTRAMUSCULAR | Status: AC
  Filled 2022-03-01: qty 1

## 2022-03-01 MED ORDER — OXYTOCIN-SODIUM CHLORIDE 30-0.9 UT/500ML-% IV SOLN
1.0000 m[IU]/min | INTRAVENOUS | Status: DC
  Administered 2022-03-01: 1 m[IU]/min via INTRAVENOUS

## 2022-03-01 MED ORDER — TETANUS-DIPHTH-ACELL PERTUSSIS 5-2.5-18.5 LF-MCG/0.5 IM SUSY: 0.5000 mL | PREFILLED_SYRINGE | Freq: Once | INTRAMUSCULAR | Status: DC

## 2022-03-01 MED ORDER — METHYLERGONOVINE MALEATE 0.2 MG/ML IJ SOLN
0.2000 mg | Freq: Once | INTRAMUSCULAR | Status: AC
Start: 2022-03-01 — End: 2022-03-01
  Administered 2022-03-01: 0.2 mg via INTRAMUSCULAR

## 2022-03-01 MED ORDER — MISOPROSTOL 200 MCG PO TABS
ORAL_TABLET | ORAL | Status: AC
  Filled 2022-03-01: qty 4

## 2022-03-01 MED ORDER — OXYCODONE HCL 5 MG PO TABS: 5.0000 mg | ORAL_TABLET | ORAL | Status: DC | PRN

## 2022-03-01 MED ORDER — DIPHENHYDRAMINE HCL 25 MG PO CAPS: 25.0000 mg | ORAL_CAPSULE | Freq: Four times a day (QID) | ORAL | Status: DC | PRN

## 2022-03-01 MED ORDER — ACETAMINOPHEN 325 MG PO TABS: 650.0000 mg | ORAL_TABLET | ORAL | Status: DC | PRN

## 2022-03-01 MED ORDER — OXYCODONE HCL 5 MG PO TABS: 10.0000 mg | ORAL_TABLET | ORAL | Status: DC | PRN

## 2022-03-01 MED ORDER — ONDANSETRON HCL 4 MG/2ML IJ SOLN: 4.0000 mg | INTRAMUSCULAR | Status: DC | PRN

## 2022-03-01 MED ORDER — SIMETHICONE 80 MG PO CHEW: 80.0000 mg | CHEWABLE_TABLET | ORAL | Status: DC | PRN

## 2022-03-01 MED ORDER — PRENATAL MULTIVITAMIN CH
1.0000 | ORAL_TABLET | Freq: Every day | ORAL | Status: DC
  Administered 2022-03-01 – 2022-03-03 (×3): 1 via ORAL
  Filled 2022-03-01 (×3): qty 1

## 2022-03-01 MED ORDER — IBUPROFEN 600 MG PO TABS
600.0000 mg | ORAL_TABLET | Freq: Four times a day (QID) | ORAL | Status: DC
Start: 2022-03-01 — End: 2022-03-03
  Administered 2022-03-01 – 2022-03-03 (×9): 600 mg via ORAL
  Filled 2022-03-01 (×10): qty 1

## 2022-03-01 MED ORDER — DIBUCAINE (PERIANAL) 1 % EX OINT: 1.0000 | TOPICAL_OINTMENT | CUTANEOUS | Status: DC | PRN

## 2022-03-01 NOTE — Lactation Note (Signed)
This note was copied from a baby's chart. Lactation Consultation Note RN concerned about baby not feeding much. Baby had good last feeding. Spoke w/mom about LPI information sheet, reviewed supplementation and pumping for LPI. Mentioned to birth parent if she didn't pump the amount needed for the hours of age we had DM and formula to supplement with after she gives her EBM first. Birthing parent is super sleepy. She didn't express supplementing w/anything else at this time. Encouraged to get rest, call for assistance as needed.  Patient Name: Belinda Bentley [id number] Date: 03/01/2022 Reason for consult: RN request Age:28 hours  Maternal Data    Feeding    LATCH Score Latch: Repeated attempts needed to sustain latch, nipple held in mouth throughout feeding, stimulation needed to elicit sucking reflex.  Audible Swallowing: A few with stimulation  Type of Nipple: Everted at rest and after stimulation  Comfort (Breast/Nipple): Soft / non-tender  Hold (Positioning): Assistance needed to correctly position infant at breast and maintain latch.  LATCH Score: 7   Lactation Tools Discussed/Used    Interventions    Discharge    Consult Status Consult Status: Follow-up Date: 03/02/22 Follow-up type: In-patient    Charyl Dancer 03/01/2022, 11:52 PM

## 2022-03-01 NOTE — Lactation Note (Signed)
This note was copied from a baby's chart. Lactation Consultation Note  Patient Name: Belinda Bentley [id number] Date: 03/01/2022 Reason for consult: Follow-up assessment;Mother's request;Primapara;1st time breastfeeding;Late-preterm 34-36.6wks;Breastfeeding assistance Age:28 hours  LC gave a few drops of colostrum. Infant latched on and off total of 10 mins before falling asleep. LC reviewed with parents different hold to get more depth.   Birth parent following plan established with LC from earlier today.    Maternal Data Has patient been taught Hand Expression?: Yes  Feeding Mother's Current Feeding Choice: Breast Milk  LATCH Score Latch: Repeated attempts needed to sustain latch, nipple held in mouth throughout feeding, stimulation needed to elicit sucking reflex.  Audible Swallowing: A few with stimulation  Type of Nipple: Flat (will evert with stimulation)  Comfort (Breast/Nipple): Soft / non-tender  Hold (Positioning): Assistance needed to correctly position infant at breast and maintain latch.  LATCH Score: 6   Lactation Tools Discussed/Used    Interventions Interventions: Breast feeding basics reviewed;Assisted with latch;Skin to skin;Breast massage;Hand express;Pre-pump if needed;Breast compression;Adjust position;Support pillows;Position options;Expressed milk;Hand pump;Education;LC Psychologist, educational;Infant Driven Feeding Algorithm education;LPT handout/interventions  Discharge Pump: DEBP;Manual  Consult Status Consult Status: Follow-up Date: 03/02/22 Follow-up type: In-patient    Elza Sortor  [name] 03/01/2022, 4:19 PM

## 2022-03-01 NOTE — Anesthesia Postprocedure Evaluation (Signed)
Anesthesia Post Note  Patient: Belinda Bentley  Procedure(s) Performed: AN AD HOC LABOR EPIDURAL     Patient location during evaluation: Mother Baby Anesthesia Type: Epidural Level of consciousness: awake Pain management: satisfactory to patient Vital Signs Assessment: post-procedure vital signs reviewed and stable Respiratory status: spontaneous breathing Cardiovascular status: stable Anesthetic complications: no   No notable events documented.  Last Vitals:  Vitals:   03/01/22 1250 03/01/22 1645  BP: 114/74 102/68  Pulse: 62 74  Resp: 14 16  Temp: 37.3 C 37.1 C  SpO2: 100% 99%    Last Pain:  Vitals:   03/01/22 1645  TempSrc: Oral  PainSc: 0-No pain   Pain Goal: Patients Stated Pain Goal: 0 (03/01/22 0138)                 Cephus Shelling

## 2022-03-01 NOTE — Lactation Note (Signed)
This note was copied from a baby's chart. Lactation Consultation Note  Patient Name: Boy Rosetta Rupnow [id number] Date: 03/01/2022 Reason for consult: L&D Initial assessment;Late-preterm 34-36.6wks;Primapara;1st time breastfeeding Age:28 hours   Initial L&D Consult:  Visited with family < 1 hour after birth Baby STS on birth parent's chest; intermittent grunting noted.  Color pink and in no apparent distress.  RN aware.  Assisted to latch, however, baby not interested in initiating a suck.  Placed him back STS on mother's chest.  Reassured family that lactation services will be available on the M/B unit.  Allowed time for family bonding.  Support person present.   Maternal Data    Feeding Mother's Current Feeding Choice: Breast Milk  LATCH Score Latch: Too sleepy or reluctant, no latch achieved, no sucking elicited.  Audible Swallowing: None  Type of Nipple: Everted at rest and after stimulation (Short shafted)  Comfort (Breast/Nipple): Soft / non-tender  Hold (Positioning): Assistance needed to correctly position infant at breast and maintain latch.  LATCH Score: 5   Lactation Tools Discussed/Used    Interventions Interventions: Assisted with latch;Skin to skin  Discharge    Consult Status Consult Status: Follow-up from L&D    Delynda Sepulveda R Tzvi Economou 03/01/2022, 6:45 AM

## 2022-03-01 NOTE — Anesthesia Procedure Notes (Signed)
Epidural Patient location during procedure: OB Start time: 03/01/2022 12:59 AM End time: 03/01/2022 1:06 AM  Staffing Anesthesiologist: Mal Amabile, MD Performed: anesthesiologist   Preanesthetic Checklist Completed: patient identified, IV checked, site marked, risks and benefits discussed, surgical consent, monitors and equipment checked, pre-op evaluation and timeout performed  Epidural Patient position: sitting Prep: DuraPrep and site prepped and draped Patient monitoring: continuous pulse ox and blood pressure Approach: midline Location: L3-L4 Injection technique: LOR air  Needle:  Needle type: Tuohy  Needle gauge: 17 G Needle length: 9 cm and 9 Needle insertion depth: 5 cm Catheter type: closed end flexible Catheter size: 19 Gauge Catheter at skin depth: 10 cm Test dose: negative and Other  Assessment Events: blood not aspirated, injection not painful, no injection resistance, no paresthesia and negative IV test  Additional Notes Patient identified. Risks and benefits discussed including failed block, incomplete  Pain control, post dural puncture headache, nerve damage, paralysis, blood pressure Changes, nausea, vomiting, reactions to medications-both toxic and allergic and post Partum back pain. All questions were answered. Patient expressed understanding and wished to proceed. Sterile technique was used throughout procedure. Epidural site was Dressed with sterile barrier dressing. No paresthesias, signs of intravascular injection Or signs of intrathecal spread were encountered.  Patient was more comfortable after the epidural was dosed. Please see RN's note for documentation of vital signs and FHR which are stable.

## 2022-03-01 NOTE — Lactation Note (Signed)
This note was copied from a baby's chart. Lactation Consultation Note  Patient Name: Belinda Bentley [id number] Date: 03/01/2022 Reason for consult: Initial assessment;Late-preterm 34-36.6wks;Primapara;1st time breastfeeding Age:28 hours   P1; Late preterm infant at 36+3 weeks Feeding preference: Breast  Visitors present when I arrived, however, since it has been 5 hours since birth I suggested that it was time to attempt to latch.  Visitors understanding and were getting ready to leave prior to my suggestion.  Reviewed the LPTI policy with parents.  Taught hand expression; no colostrum drops noted at this time.  Encouraged lots of STS, breast massage and hand expression.  Demonstrated finger feeding when drops obtained.  Awakened "[name]" and he began actively crying.  Attempted to assess a suck on my gloved finger but he was too upset to suck.  When placing him near the breast to latch he immediately calmed down but was not interested in opening his mouth for latching.  Placed him STS on father's chest where he fell asleep immediately.  Suggested birth parent begin to pump with the DEBP; she was receptive.  Pump parts, set up and cleaning reviewed.  Assessed the #21 flanges to be the appropriate size at this time.  Taught mother how to correctly assess for correct size.  Also provided breast shells for nipple eversion and demonstrated the manual pump.  Recommended pre-pumping prior to latching to help evert nipple.  Discussed the possibility of beginning to supplement if "[name]" does not show an interest in latching with the next feeding; will see how he does with latching and also see if birth parent is able to obtain any colostrum with hand expression and/or pumping.  Parents verbalized understanding.  RN updated and asked to start supplementing at the next feeding if needed.    Grandmother present and supportive.  All family members interested in learning how to care for this  LPTI.   Maternal Data Has patient been taught Hand Expression?: Yes Does the patient have breastfeeding experience prior to this delivery?: No  Feeding Mother's Current Feeding Choice: Breast Milk  LATCH Score Latch: Too sleepy or reluctant, no latch achieved, no sucking elicited.  Audible Swallowing: None  Type of Nipple: Everted at rest and after stimulation (Short shafted)  Comfort (Breast/Nipple): Soft / non-tender  Hold (Positioning): Assistance needed to correctly position infant at breast and maintain latch.  LATCH Score: 5   Lactation Tools Discussed/Used Tools: Pump;Flanges Flange Size: 21 Breast pump type: Double-Electric Breast Pump;Manual Pump Education: Setup, frequency, and cleaning;Milk Storage Reason for Pumping: Breast stimulation for supplementation for late preterm infant Pumping frequency: Every three hours  Interventions Interventions: Breast feeding basics reviewed;Assisted with latch;Skin to skin;Breast massage;Hand express;Pre-pump if needed;Breast compression;Hand pump;Shells;Position options;Support pillows;Adjust position;DEBP;Education;LC Services brochure  Discharge Pump: Personal (Evenflo)  Consult Status Consult Status: Follow-up Date: 03/02/22 Follow-up type: In-patient    Jacque Byron R Jailine Lieder 03/01/2022, 12:01 PM

## 2022-03-01 NOTE — Anesthesia Preprocedure Evaluation (Signed)
Anesthesia Evaluation  Patient identified by MRN, date of birth, ID band Patient awake    Reviewed: Allergy & Precautions, Patient's Chart, lab work & pertinent test results  Airway Mallampati: II  TM Distance: >3 FB Neck ROM: Full    Dental no notable dental hx.    Pulmonary neg pulmonary ROS,    Pulmonary exam normal        Cardiovascular negative cardio ROS Normal cardiovascular exam     Neuro/Psych Anxiety negative neurological ROS     GI/Hepatic Neg liver ROS, GERD  ,  Endo/Other  negative endocrine ROS  Renal/GU negative Renal ROS  negative genitourinary   Musculoskeletal negative musculoskeletal ROS (+)   Abdominal   Peds  Hematology  (+) Blood dyscrasia, anemia , Thrombocytopenia   Anesthesia Other Findings   Reproductive/Obstetrics (+) Pregnancy                             Anesthesia Physical Anesthesia Plan  ASA: 2  Anesthesia Plan: Epidural   Post-op Pain Management:    Induction:   PONV Risk Score and Plan:   Airway Management Planned: Natural Airway  Additional Equipment:   Intra-op Plan:   Post-operative Plan:   Informed Consent: I have reviewed the patients History and Physical, chart, labs and discussed the procedure including the risks, benefits and alternatives for the proposed anesthesia with the patient or authorized representative who has indicated his/her understanding and acceptance.       Plan Discussed with: Anesthesiologist  Anesthesia Plan Comments:         Anesthesia Quick Evaluation

## 2022-03-01 NOTE — Progress Notes (Signed)
Delivery Note At 5:54 AM a viable female was delivered via Vaginal, Spontaneous (Presentation:LOA      ).  APGAR: 8, 9; weigh pending  .   Placenta status: Spontaneous, Intact.  Cord: 3 vessels with the following complications: nuchal x 1.  Cord pH: pending  Anesthesia: Epidural Episiotomy: None Lacerations:  second degree outlet lac repaired Suture Repair: 2.0 vicryl rapide Est. Blood Loss (mL):  400 Mild PP atony controlled with pitocin, massage, IM methergine 0.2mg  x 1 and misoprostol 800 mcg PR. Peds present for delivery  Mom to postpartum.  Baby to Couplet care / Skin to Skin.  [name] II 03/01/2022, 6:17 AM

## 2022-03-02 LAB — CBC
HCT: 28 % — ABNORMAL LOW (ref 36.0–46.0)
Hemoglobin: 9.6 g/dL — ABNORMAL LOW (ref 12.0–15.0)
MCH: 30.7 pg (ref 26.0–34.0)
MCHC: 34.3 g/dL (ref 30.0–36.0)
MCV: 89.5 fL (ref 80.0–100.0)
Platelets: 103 10*3/uL — ABNORMAL LOW (ref 150–400)
RBC: 3.13 MIL/uL — ABNORMAL LOW (ref 3.87–5.11)
RDW: 13.2 % (ref 11.5–15.5)
WBC: 9.4 10*3/uL (ref 4.0–10.5)
nRBC: 0 % (ref 0.0–0.2)

## 2022-03-02 NOTE — Social Work (Signed)
MOB was referred for history of anxiety.   * Referral screened out by Clinical Social Worker because none of the following criteria appear to apply:  ~ History of anxiety/depression during this pregnancy, or of post-partum depression following prior delivery. ~ Diagnosis of anxiety and/or depression within last 3 years. OR * MOB's symptoms currently being treated with medication and/or therapy. Pt currently prescribed Lexapro 10mg.  Please contact the Clinical Social Worker if needs arise, by MOB request, or if MOB scores greater than 9/yes to question 10 on Edinburgh Postpartum Depression Screen.  Khriz Liddy, LCSW Clinical Social Work [facility]  

## 2022-03-02 NOTE — Lactation Note (Signed)
This note was copied from a baby's chart. Lactation Consultation Note  Patient Name: Belinda Bentley [id number] Date: 03/02/2022 Reason for consult: Follow-up assessment;1st time breastfeeding;Primapara;Late-preterm 34-36.6wks Age:28 hours   P1: Late preterm infant born at 36+3 weeks with a CGA of 36+4 weeks Feeding preference: Breast/formula Baby had a 3% weight loss < 24 hours old  Birth parent has been breast feeding exclusively since birth.  Set her up with the electric breast pump early yesterday morning to help with breast stimulation.  She has not yet been able to obtain any colostrum with pumping.  Reinforced the importance of pumping consistently every three hours after breast feeding and to include hand expression before/after pumping.  Reviewed hand expression with her; few drops only obtained and finger fed to "Belinda Bentley."  Offered to assist with waking and latching; birth parent receptive.  Assisted to latch easily, however, "Belinda Bentley" was not at all interested in initiating a suck.  Gentle stimulation performed without success.  He continues to appear very sleepy.  Removed him from the breast, awakened easily and latched again with the same results.  Discussed supplementation and the benefits of beginning supplementation.  Parents amenable to begin.  Options presented and parents in agreement to use formula.  Reviewed the LPTI guidelines again from yesterday.  Demonstrated paced bottle feeding using the purple extra slow flow nipple.  "Belinda Bentley" took some encouragement to begin sucking, requiring cheek and jaw support.  Parents and grandmother observing techniques.  He was able to consume 13 mls with a nice rhythmic pace; no distress noted.  Showed parents effective burping and he burped well.  Feeding plan to include breast feeding (no more than 5-10 minutes if he does not seem interested, followed by supplementation by support person and pumping by birth parent.  Asked parents to call the RN  for any feeding difficulties.  "Belinda Bentley" needs to consume between 10-20 mls/feeding; parents verbalized understanding.  Birth parent pumped again and obtained 0 mls at this time.  Encouragement given.  Family appreciative; grandmother also present for education.  RN and pediatrician updated.    Maternal Data Has patient been taught Hand Expression?: Yes Does the patient have breastfeeding experience prior to this delivery?: No  Feeding Mother's Current Feeding Choice: Breast Milk and Formula Nipple Type: Extra Slow Flow  LATCH Score Latch: Too sleepy or reluctant, no latch achieved, no sucking elicited.  Audible Swallowing: None  Type of Nipple: Everted at rest and after stimulation (Short shafted)  Comfort (Breast/Nipple): Soft / non-tender  Hold (Positioning): Assistance needed to correctly position infant at breast and maintain latch.  LATCH Score: 5   Lactation Tools Discussed/Used Tools: Shells;Pump;Flanges;Bottle Flange Size: 21 Breast pump type: Double-Electric Breast Pump;Manual Pump Education: Setup, frequency, and cleaning;Milk Storage Reason for Pumping: Breast stimulation for supplementation Pumping frequency: Every three hours Pumped volume: 0 mL  Interventions Interventions: Pace feeding;Education  Discharge Pump: Personal (Evenflo)  Consult Status Consult Status: Follow-up Date: 03/03/22 Follow-up type: In-patient    Belinda Bentley 03/02/2022, 12:47 PM

## 2022-03-02 NOTE — Lactation Note (Signed)
This note was copied from a baby's chart. Lactation Consultation Note Birthing parent called for latch assistance. Baby has no interest in BF at this time. Birthing parent stated she used DEBP but got nothing. Mom is very sleepy. Placed baby back in bassinet. Encouraged to rest.  Birthing parent still hasn't decided to supplement w/anything other than her BM. Encouraged to call for assistance as needed.  Patient Name: Belinda Bentley Date: 03/02/2022 Reason for consult: Mother's request;Primapara;Late-preterm 34-36.6wks Age:75 hours  Maternal Data    Feeding    LATCH Score Latch: Too sleepy or reluctant, no latch achieved, no sucking elicited.  Audible Swallowing: None  Type of Nipple: Everted at rest and after stimulation  Comfort (Breast/Nipple): Soft / non-tender  Hold (Positioning): Full assist, staff holds infant at breast  LATCH Score: 4   Lactation Tools Discussed/Used Breast pump type: Double-Electric Breast Pump Reason for Pumping: LPI Pumping frequency: q3h Pumped volume: 0 mL  Interventions Interventions: Assisted with latch;Skin to skin;Breast massage;Hand express;Breast compression;Adjust position;Support pillows;Position options  Discharge    Consult Status Consult Status: Follow-up Date: 03/02/22 Follow-up type: In-patient    Shalva Rozycki, Diamond Nickel 03/02/2022, 3:28 AM

## 2022-03-02 NOTE — Progress Notes (Signed)
Post Partum Day 1 Subjective: no complaints, up ad lib, voiding, tolerating PO, and + flatus  Objective: Blood pressure 93/64, pulse 71, temperature 98.8 F (37.1 C), temperature source Oral, resp. rate 16, height 5' 1.5" (1.562 m), weight 66.7 kg, last menstrual period 03/07/2021, SpO2 99 %, unknown if currently breastfeeding.  Physical Exam:  General: alert, cooperative, and no distress Lochia: appropriate Uterine Fundus: firm Incision: healing well DVT Evaluation: No evidence of DVT seen on physical exam.  Recent Labs    03/01/22 0655 03/02/22 0449  HGB 11.1* 9.6*  HCT 31.6* 28.0*    Assessment/Plan: Plan for discharge tomorrow Baby was 36 weeks and just started feeding well. Patient prefers to do circumcision tomorrow.  LOS: 2 days   Leslie Andrea, MD 03/02/2022, 7:19 AM

## 2022-03-03 MED ORDER — MEASLES, MUMPS & RUBELLA VAC IJ SOLR
0.5000 mL | Freq: Once | INTRAMUSCULAR | Status: AC
  Administered 2022-03-03: 0.5 mL via SUBCUTANEOUS
  Filled 2022-03-03: qty 0.5

## 2022-03-03 NOTE — Lactation Note (Addendum)
This note was copied from a baby's chart. Lactation Consultation Note  Patient Name: Belinda Bentley [id number] Date: 03/03/2022 Reason for consult: Follow-up assessment;1st time breastfeeding;Primapara;Late-preterm 34-36.6wks Age:28 hours  LC in to visit with P1 Mom of LPTI who is at a 5.5% weight loss.  This is a loss of 80 gms from yesterday.  Baby's output is good.  Baby has been latching and feeding for 10-20 mins and Mom reports baby is feeding better today.  Baby is being supplemented with 22 cal formula by bottle (extra slow flow nipple), volume at 20-30 ml today.    Mom has been consistently double pumping, getting drops.  LC moved the drying bin to the other side of wall on counter, explaining why this was best for cleanliness.   Mom has an Evenflow DEBP at home.  She had not contacted her insurance company yet.  Offered to submit a STORK pump request and Mom agreeable.  Talked to parents regarding the importance of f/u with OP lactation appt.  On discharge, LC will message our OP LC.  Plan- 1- STS as much as possible 2- At least every 3 hrs, sooner if baby is hungry, offer the breast with a deep latch to the breast. Limit breastfeeding to no more than 30 mins. 3-Supplement per LPTI guidelines with 20-30 ml EBM+/22 cal formula by paced bottle 4- Pump both breasts on initiation setting. 5- call for help with positioning and latching prn.    Lactation Tools Discussed/Used Tools: Pump;Flanges;Bottle Flange Size: 21 Breast pump type: Double-Electric Breast Pump Pump Education: Setup, frequency, and cleaning;Milk Storage Reason for Pumping: Support milk supply/LPTI Pumping frequency: Pumping every 3 hrs on initiation setting Pumped volume: 0 mL (drops)  Interventions Interventions: Breast feeding basics reviewed;Skin to skin;Breast massage;Hand express;Education;DEBP  Discharge Discharge Education: Outpatient recommendation Pump: Stork Pump (faxed form and  called)  Consult Status Consult Status: Follow-up Date: 03/04/22 Follow-up type: In-patient    Judee Clara 03/03/2022, 9:04 AM

## 2022-03-03 NOTE — Plan of Care (Signed)
Discharge instructions given with after visit summary, pt receptive.  

## 2022-03-03 NOTE — Discharge Summary (Signed)
Postpartum Discharge Summary  Date of Service March 03, 2022     Patient Name: Belinda Bentley DOB: 1995/08/26 MRN: [id number]  Date of admission: 02/28/2022 Delivery date:03/01/2022  Delivering provider: Everlene Farrier  Date of discharge: 03/03/2022  Admitting diagnosis: History of preterm premature rupture of membranes (PPROM) [Z87.59] Intrauterine pregnancy: [redacted]w[redacted]d    Secondary diagnosis:  Principal Problem:   History of preterm premature rupture of membranes (PPROM)  Additional problems: none    Discharge diagnosis: Preterm Pregnancy Delivered                                              Post partum procedures: none Augmentation: Pitocin Complications: None  Hospital course: Onset of Labor With Vaginal Delivery      26y.o. yo G1P0101 at 333w3das admitted in Latent Labor on 02/28/2022. Patient had an uncomplicated labor course as follows:  Membrane Rupture Time/Date:  ,02/21/2022   Delivery Method:Vaginal, Spontaneous  Episiotomy: None  Lacerations:  2nd degree  Patient had an uncomplicated postpartum course.  She is ambulating, tolerating a regular diet, passing flatus, and urinating well. Patient is discharged home in stable condition on 03/03/22.  Newborn Data: Birth date:03/01/2022  Birth time:5:54 AM  Gender:Female  Living status:Living  Apgars:8 ,9  Weight:2820 g   Magnesium Sulfate received: No BMZ received: No Rhophylac:N/A MMR:N/A T-DaP:Given prenatally Flu: N/A Transfusion:No  Physical exam  Vitals:   03/01/22 2101 03/02/22 0455 03/02/22 2049 03/03/22 0555  BP: 91/66 93/64 99/60 102/60  Pulse: 75 71 67 63  Resp: _0 Temp: 98.7 F (37.1 C) 98.8 F (37.1 C) 98.4 F (36.9 C) 99 F (37.2 C)  TempSrc: Oral Oral Oral Oral  SpO2:      Weight:      Height:       General: alert, cooperative, and no distress Lochia: appropriate Uterine Fundus: firm Incision: Healing well with no significant drainage DVT Evaluation: No evidence of DVT  seen on physical exam. Labs: Lab Results  Component Value Date   WBC 9.4 03/02/2022   HGB 9.6 (L) 03/02/2022   HCT 28.0 (L) 03/02/2022   MCV 89.5 03/02/2022   PLT 103 (L) 03/02/2022      Latest Ref Rng & Units 10/15/2021    9:02 AM  CMP  Total Protein 6.0 - 8.5 g/dL 6.4   Total Bilirubin 0.0 - 1.2 mg/dL 0.3   Alkaline Phos 44 - 121 IU/L 57   AST 0 - 40 IU/L 25   ALT 0 - 32 IU/L 24    Edinburgh Score:    03/02/2022    9:06 PM  Edinburgh Postnatal Depression Scale Screening Tool  I have been able to laugh and see the funny side of things. 0  I have looked forward with enjoyment to things. 0  I have blamed myself unnecessarily when things went wrong. 1  I have been anxious or worried for no good reason. 2  I have felt scared or panicky for no good reason. 1  Things have been getting on top of me. 0  I have been so unhappy that I have had difficulty sleeping. 0  I have felt sad or miserable. 0  I have been so unhappy that I have been crying. 0  The thought of harming myself has occurred to me. 0  Edinburgh Postnatal Depression  Scale Total 4      After visit meds:  Allergies as of 03/03/2022       Reactions   Augmentin [amoxicillin-pot Clavulanate] Nausea And Vomiting   Banana Itching   Sulfa Antibiotics Nausea And Vomiting        Medication List     TAKE these medications    acetaminophen 500 MG tablet Commonly known as: TYLENOL Take 500 mg by mouth daily as needed for mild pain or headache.   escitalopram 10 MG tablet Commonly known as: LEXAPRO Take 1 and 1/2 Tablets Daily. What changed:  how much to take how to take this when to take this additional instructions   JUICE PLUS FIBRE PO Take 6 capsules by mouth daily.   PRENATAL PO Take by mouth daily.   triamcinolone cream 0.1 % Commonly known as: KENALOG Apply 1 application topically 2 (two) times daily.         Discharge home in stable condition Infant Feeding: Breast Infant  Disposition: rooming in Discharge instruction: per After Visit Summary and Postpartum booklet. Activity: Advance as tolerated. Pelvic rest for 6 weeks.  Diet: routine diet Anticipated Birth Control: Unsure Postpartum Appointment:6 weeks Additional Postpartum F/U:  not applicable Future Appointments: Future Appointments  Date Time Provider [place]  06/16/2022  9:00 AM Jerrol Banana., MD [id number]   Follow up Visit:      03/03/2022 Cyril Mourning, MD

## 2022-03-04 ENCOUNTER — Ambulatory Visit: Payer: Self-pay

## 2022-03-04 LAB — SURGICAL PATHOLOGY

## 2022-03-04 NOTE — Lactation Note (Signed)
This note was copied from a baby's chart. Lactation Consultation Note  Patient Name: Belinda Bentley [id number] Date: 03/04/2022 Reason for consult: Follow-up assessment;Mother's request;Exclusive pumping and bottle feeding;Late-preterm 34-36.6wks;Infant weight loss;Breastfeeding assistance Age:28 hours  Infant bottle feeding only for now getting EBM followed by formula, last volume 35 ml. Parents are increasing as tolerated.   Birth parent engorged left more so than right. We increased flange size with gentle massage, little milk expressed. LC applied ice and increased flange size, pumped to relieve discomfort.   Plan 1. To feed based on cues 8-12x 24hr period.  2. BP to offer breast milk first then formula increasing volume as tolerated. Volume guidelines provided for bottle feeding infant 3. Post pump after each feeding on maintenance setting for 20 mins.   All questions answered at the end of the visit.    Maternal Data Has patient been taught Hand Expression?: Yes  Feeding Mother's Current Feeding Choice: Breast Milk and Formula  LATCH Score                    Lactation Tools Discussed/Used Tools: Pump;Flanges Flange Size: 24 Breast pump type: Double-Electric Breast Pump (left breast engorged compared to right. We attached pump increased flange size with gentle massage, little results. LC provided ICE,  birth parent in reclined position. LC to return to release milk with pumping.) Pump Education: Setup, frequency, and cleaning;Milk Storage Reason for Pumping: pumping only Pumping frequency: pump after each feeding on maintenance for 20 mins (BP getting 20 ml per pumping session.)  Interventions Interventions: Breast feeding basics reviewed;Breast massage;Hand express;Reverse pressure;Expressed milk;DEBP;Education;LC Psychologist, educational;Infant Driven Feeding Algorithm education;LPT handout/interventions  Discharge Discharge Education: Engorgement and breast  care;Warning signs for feeding baby;Outpatient recommendation;Outpatient Epic message sent Pump: DEBP;Stork Pump  Consult Status Consult Status: Complete Date: 03/04/22    Belinda Bentley  Belinda Bentley 03/04/2022, 11:59 AM

## 2022-03-10 ENCOUNTER — Telehealth (HOSPITAL_COMMUNITY): Payer: Self-pay | Admitting: *Deleted

## 2022-03-10 NOTE — Telephone Encounter (Signed)
Mom reports feeling good. No concerns about herself at this time. EPDS=2 [facility] score=4) Mom reports baby is doing well. Feeding, peeing, and pooping without difficulty. Safe sleep reviewed. Mom reports no concerns about baby at present.  Duffy Rhody, RN 03-10-2022 at 1:35pm

## 2022-03-26 ENCOUNTER — Inpatient Hospital Stay (HOSPITAL_COMMUNITY): Admit: 2022-03-26 | Payer: Self-pay

## 2022-06-16 ENCOUNTER — Encounter: Payer: 59 | Admitting: Family Medicine

## 2023-04-15 ENCOUNTER — Other Ambulatory Visit: Payer: Self-pay

## 2023-04-15 ENCOUNTER — Emergency Department
Admission: EM | Admit: 2023-04-15 | Discharge: 2023-04-15 | Disposition: A | Discharge status: Home / Self Care | Payer: 59 | Attending: Emergency Medicine | Admitting: Emergency Medicine

## 2023-04-15 DIAGNOSIS — S0341XA Sprain of jaw, right side, initial encounter: Secondary | ICD-10-CM | POA: Insufficient documentation

## 2023-04-15 DIAGNOSIS — X58XXXA Exposure to other specified factors, initial encounter: Secondary | ICD-10-CM | POA: Insufficient documentation

## 2023-04-15 DIAGNOSIS — S0993XA Unspecified injury of face, initial encounter: Secondary | ICD-10-CM | POA: Diagnosis present

## 2023-04-15 DIAGNOSIS — S0340XA Sprain of jaw, unspecified side, initial encounter: Secondary | ICD-10-CM

## 2023-04-15 LAB — CBC WITH DIFFERENTIAL/PLATELET
Abs Immature Granulocytes: 0.03 10*3/uL (ref 0.00–0.07)
Basophils Absolute: 0 10*3/uL (ref 0.0–0.1)
Basophils Relative: 0 %
Eosinophils Absolute: 0.1 10*3/uL (ref 0.0–0.5)
Eosinophils Relative: 1 %
HCT: 36.7 % (ref 36.0–46.0)
Hemoglobin: 12.6 g/dL (ref 12.0–15.0)
Immature Granulocytes: 0 %
Lymphocytes Relative: 15 %
Lymphs Abs: 1.2 10*3/uL (ref 0.7–4.0)
MCH: 29.1 pg (ref 26.0–34.0)
MCHC: 34.3 g/dL (ref 30.0–36.0)
MCV: 84.8 fL (ref 80.0–100.0)
Monocytes Absolute: 0.5 10*3/uL (ref 0.1–1.0)
Monocytes Relative: 6 %
Neutro Abs: 6.4 10*3/uL (ref 1.7–7.7)
Neutrophils Relative %: 78 %
Platelets: 188 10*3/uL (ref 150–400)
RBC: 4.33 MIL/uL (ref 3.87–5.11)
RDW: 12.2 % (ref 11.5–15.5)
WBC: 8.2 10*3/uL (ref 4.0–10.5)
nRBC: 0 % (ref 0.0–0.2)

## 2023-04-15 LAB — BASIC METABOLIC PANEL
Anion gap: 9 (ref 5–15)
BUN: 8 mg/dL (ref 6–20)
CO2: 23 mmol/L (ref 22–32)
Calcium: 8.9 mg/dL (ref 8.9–10.3)
Chloride: 106 mmol/L (ref 98–111)
Creatinine, Ser: 0.56 mg/dL (ref 0.44–1.00)
GFR, Estimated: >60 mL/min (ref >60)
Glucose, Bld: 161 mg/dL — ABNORMAL HIGH (ref 70–99)
Potassium: 3.3 mmol/L — ABNORMAL LOW (ref 3.5–5.1)
Sodium: 138 mmol/L (ref 135–145)

## 2023-04-15 LAB — MAGNESIUM: Magnesium: 2.1 mg/dL (ref 1.7–2.4)

## 2023-04-15 MED ORDER — HYDROXYZINE HCL 50 MG PO TABS
50.0000 mg | ORAL_TABLET | Freq: Once | ORAL | Status: AC
  Administered 2023-04-15: 50 mg via ORAL
  Filled 2023-04-15: qty 1

## 2023-04-15 NOTE — Discharge Instructions (Signed)
Take acetaminophen 650 mg and ibuprofen 400 mg every 6 hours for pain.  Take with food.  Your electrolytes were within the normal range, only the potassium was slightly low.  See dietary recommendations for increasing potassium.  See your doctor this week for checkup.  Thank you for choosing [place] for your health care today!  Please see your primary doctor this week for a follow up appointment.   If you have any new, worsening, or unexpected symptoms call your doctor right away or come back to the emergency department for reevaluation.  It was my pleasure to care for you today.   Daneil Dan [place] Charon, MD

## 2023-04-15 NOTE — ED Triage Notes (Addendum)
Pt to ED via POV c/o her jaw being locked up. Pt was in shower 1 hr ago and was having a panic attack when her jaw locked up. Pt able to speak in complete sentences. Pt took .25mg  xanax prior to arrival

## 2023-04-15 NOTE — ED Provider Notes (Signed)
[facility] Provider Note    Event Date/Time   First MD Initiated Contact with Patient 04/15/23 1958     (approximate)   History   Jaw Pain   HPI  Belinda Bentley is a 27 y.o. female   Past medical history of generalized anxiety disorder on an SSRI presents emergency department with jaw tightness while in the shower.  She experienced jaw pain especially to the right TMJ area and started clenching her teeth and grinding uncontrollably.  This in turn to a panic attack. The sensation has since stopped.  She continues to feel some soreness to the right TMJ area.  She denies any recent illnesses, has otherwise been in her regular state of health, no other muscle pains or spasms no other acute medical complaints.  Independent Historian contributed to assessment above: Husband is at bedside to corroborate information and past medical history as above    Physical Exam   Triage Vital Signs: ED Triage Vitals  Encounter Vitals Group     BP 04/15/23 1948 104/68     Systolic BP Percentile --      Diastolic BP Percentile --      Pulse Rate 04/15/23 1948 (!) 104     Resp 04/15/23 1948 (!) 21     Temp 04/15/23 1948 98.1 F (36.7 C)     Temp Source 04/15/23 1948 Oral     SpO2 04/15/23 1948 100 %     Weight 04/15/23 1944 130 lb (59 kg)     Height 04/15/23 1944 5\' 1"  (1.549 m)     Head Circumference --      Peak Flow --      Pain Score 04/15/23 1944 7     Pain Loc --      Pain Education --      Exclude from Growth Chart --     Most recent vital signs: Vitals:   04/15/23 1948  BP: 104/68  Pulse: (!) 104  Resp: (!) 21  Temp: 98.1 F (36.7 C)  SpO2: 100%    General: Awake, no distress.  CV:  Good peripheral perfusion.  Resp:  Normal effort.  Abd:  No distention.  Other:  Awake alert pleasant in no acute distress.  Mild tachycardia otherwise vital signs normal.  She is able to speak normally, jaw appears not to be in spasm.  She is moving all  extremities with full active range of motion is ambulating well.   ED Results / Procedures / Treatments   Labs (all labs ordered are listed, but only abnormal results are displayed) Labs Reviewed  BASIC METABOLIC PANEL - Abnormal; Notable for the following components:      Result Value   Potassium 3.3 (*)    Glucose, Bld 161 (*)    All other components within normal limits  CBC WITH DIFFERENTIAL/PLATELET  MAGNESIUM     I ordered and reviewed the above labs they are notable for normal electrolytes.   PROCEDURES:  Critical Care performed: No  Procedures   MEDICATIONS ORDERED IN ED: Medications  hydrOXYzine (ATARAX) tablet 50 mg (50 mg Oral Given 04/15/23 2109)    IMPRESSION / MDM / ASSESSMENT AND PLAN / ED COURSE  I reviewed the triage vital signs and the nursing notes.                                Patient's presentation is most consistent with acute  presentation with potential threat to life or bodily function.  Differential diagnosis includes, but is not limited to, muscle spasm, TMJ, anxiety attack, tetanus, electrolyte disturbance    MDM:    I am not sure what is causing her transient jaw tightness and soreness but may be TMJ related, anxiety related, electrolyte related.  Lab testing unremarkable electrolytes within normal limits.  Transient symptoms have completely resolved.  Doubt tetanus given transient nature of symptoms, vaccinations up-to-date, no wounds or contamination.  She feels well so she will be discharged.       FINAL CLINICAL IMPRESSION(S) / ED DIAGNOSES   Final diagnoses:  TMJ (sprain of temporomandibular joint), initial encounter     Rx / DC Orders   ED Discharge Orders     None        Note:  This document was prepared using Dragon voice recognition software and may include unintentional dictation errors.    Pilar Jarvis, MD 04/16/23 [phone]
